# Patient Record
Sex: Female | Born: 1985 | Race: Black or African American | Hispanic: No | Marital: Single | Smoking: Former smoker
Health system: Southern US, Community
[De-identification: ages and names within clinical notes are randomized; demographics above are authoritative.]

## PROBLEM LIST (undated history)

## (undated) DIAGNOSIS — N390 Urinary tract infection, site not specified: Secondary | ICD-10-CM

## (undated) DIAGNOSIS — B009 Herpesviral infection, unspecified: Secondary | ICD-10-CM

## (undated) HISTORY — PX: NO PAST SURGERIES: SHX2092

## (undated) HISTORY — PX: TUBAL LIGATION: SHX77

---
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
## 2005-01-19 ENCOUNTER — Emergency Department (HOSPITAL_COMMUNITY): Admission: EM | Admit: 2005-01-19 | Discharge: 2005-01-19 | Payer: Self-pay | Admitting: Emergency Medicine

## 2005-04-12 ENCOUNTER — Emergency Department (HOSPITAL_COMMUNITY): Admission: EM | Admit: 2005-04-12 | Discharge: 2005-04-13 | Payer: Self-pay | Admitting: Emergency Medicine

## 2005-10-14 ENCOUNTER — Emergency Department (HOSPITAL_COMMUNITY): Admission: EM | Admit: 2005-10-14 | Discharge: 2005-10-14 | Payer: Self-pay | Admitting: Emergency Medicine

## 2006-05-21 ENCOUNTER — Inpatient Hospital Stay (HOSPITAL_COMMUNITY): Admission: RE | Admit: 2006-05-21 | Discharge: 2006-05-23 | Payer: Self-pay | Admitting: Obstetrics and Gynecology

## 2006-11-13 ENCOUNTER — Emergency Department (HOSPITAL_COMMUNITY): Admission: EM | Admit: 2006-11-13 | Discharge: 2006-11-13 | Payer: Self-pay | Admitting: Emergency Medicine

## 2007-08-13 ENCOUNTER — Emergency Department (HOSPITAL_COMMUNITY): Admission: EM | Admit: 2007-08-13 | Discharge: 2007-08-13 | Payer: Self-pay | Admitting: Emergency Medicine

## 2009-06-25 ENCOUNTER — Emergency Department (HOSPITAL_COMMUNITY): Admission: EM | Admit: 2009-06-25 | Discharge: 2009-06-25 | Payer: Self-pay | Admitting: Emergency Medicine

## 2010-10-15 ENCOUNTER — Emergency Department (HOSPITAL_COMMUNITY)
Admission: EM | Admit: 2010-10-15 | Discharge: 2010-10-15 | Disposition: A | Discharge status: Home / Self Care | Payer: Self-pay | Attending: Emergency Medicine | Admitting: Emergency Medicine

## 2010-10-15 ENCOUNTER — Emergency Department (HOSPITAL_COMMUNITY): Payer: Self-pay

## 2010-10-15 DIAGNOSIS — X58XXXA Exposure to other specified factors, initial encounter: Secondary | ICD-10-CM | POA: Insufficient documentation

## 2010-10-15 DIAGNOSIS — J45909 Unspecified asthma, uncomplicated: Secondary | ICD-10-CM | POA: Insufficient documentation

## 2010-10-15 DIAGNOSIS — M79609 Pain in unspecified limb: Secondary | ICD-10-CM | POA: Insufficient documentation

## 2010-10-15 DIAGNOSIS — IMO0002 Reserved for concepts with insufficient information to code with codable children: Secondary | ICD-10-CM | POA: Insufficient documentation

## 2010-10-15 DIAGNOSIS — M25559 Pain in unspecified hip: Secondary | ICD-10-CM | POA: Insufficient documentation

## 2010-10-15 LAB — POCT PREGNANCY, URINE: Preg Test, Ur: NEGATIVE

## 2011-01-13 ENCOUNTER — Emergency Department (HOSPITAL_COMMUNITY)
Admission: EM | Admit: 2011-01-13 | Discharge: 2011-01-13 | Disposition: A | Discharge status: Home / Self Care | Payer: Self-pay | Attending: Emergency Medicine | Admitting: Emergency Medicine

## 2011-01-13 DIAGNOSIS — K047 Periapical abscess without sinus: Secondary | ICD-10-CM | POA: Insufficient documentation

## 2011-01-13 DIAGNOSIS — K029 Dental caries, unspecified: Secondary | ICD-10-CM | POA: Insufficient documentation

## 2011-01-13 DIAGNOSIS — J45909 Unspecified asthma, uncomplicated: Secondary | ICD-10-CM | POA: Insufficient documentation

## 2011-01-13 DIAGNOSIS — K089 Disorder of teeth and supporting structures, unspecified: Secondary | ICD-10-CM | POA: Insufficient documentation

## 2011-01-25 NOTE — Op Note (Signed)
NAME:  Joan Peterson, Joan Peterson              ACCOUNT NO.:  [id number]   MEDICAL RECORD NO.:  [id number]          PATIENT TYPE:  INP   LOCATION:  LDR4                          FACILITY:  [facility]   PHYSICIAN:  Lazaro Arms, M.D.   DATE OF BIRTH:  1986-01-08   DATE OF PROCEDURE:  11/08/2004  DATE OF DISCHARGE:                                 OPERATIVE REPORT   DELIVERY NOTE:  Joan Peterson was noted to be 10 cm dilated at +2 station with an  urge to push at approximately 2:30.  After a very brief second stage, she  delivered a viable female infant at 13.  Twenty units of Pitocin dilated in  1000 mL of lactated Ringer's was then infused rapidly IV.  The placenta  separated spontaneously and delivered via controlled cord traction at 1445.  It was inspected and appears to be intact with a three-vessel cord.  As soon  as the placenta came out, heavy lochia was noted and the uterus was soft.  Despite vigorous massage and bimanual exam, uterine atony remained.  She was  given 0.2 mg of Methergine IM.  After a few minutes, the fundus did become  firm and the bleeding stopped.  Estimated blood loss was about 750 mL.  The  weight of the baby is 7 pounds 11 ounces, Apgars are 9 and 9.  Her vagina  did not have any lacerations.      FC/MEDQ  D:  11/08/2004  T:  11/08/2004  Job:  [id number]

## 2011-01-25 NOTE — H&P (Signed)
NAME:  Joan Peterson, Joan Peterson              ACCOUNT NO.:  [id number]   MEDICAL RECORD NO.:  [id number]          PATIENT TYPE:  INP   LOCATION:  A413                          FACILITY:  [facility]   PHYSICIAN:  Lazaro Arms, M.D.   DATE OF BIRTH:  01/13/1986   DATE OF ADMISSION:  05/21/2006  DATE OF DISCHARGE:  LH                                HISTORY & PHYSICAL   CHIEF COMPLAINT:  Labor.   HISTORY OF PRESENT ILLNESS:  Joan Peterson is a 25 year old, gravida 2, para 1 with  an EDC of May 27, 2006 placing her at [redacted] weeks gestation. She began  prenatal care at about [redacted] weeks gestation and has had regular visits since  then. The prenatal course has basically been uneventful.   PRENATAL LABS:  Blood type O+, rubella immune. HBsAg, HIV, RPR, gonorrhea,  chlamydia are all negative. 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 She has a history of positive group B strep,  the culture was negative with this pregnancy.   PAST MEDICAL HISTORY:  Noncontributory.   PAST SURGICAL HISTORY:  None.   ALLERGIES:  None.   MEDICATIONS:  1. Prenatal vitamins.  2. Valtrex 1 gram p.o. daily since [redacted] weeks gestation.   SOCIAL HISTORY:  Single, denies cigarette smoking, alcohol or drug use.   FAMILY HISTORY:  Diabetes.   OB HISTORY:  Had a vaginal delivery of a 7 pound 14 ounce female at [redacted] weeks  gestation in 2006 without complications.   PHYSICAL EXAMINATION:  HEENT:  Within normal limits.  HEART:  Regular rate and rhythm.  LUNGS:  Clear.  ABDOMEN:  Soft and nontender. She is having mild to moderate contractions  every 2-4 minutes.  CERVIX:  Cervical exam upon admission was 4 cm, 50% effaced, -2 station.  After an hour or so of observation, she did thin out a little bit more and  the cervix became more anterior so we are going to keep her for evaluation  and management of labor. We will start her on GBS prophylaxis. Fetal heart  rate is reactive without decels.      Jacklyn Shell, C.N.M.      Lazaro Arms, M.D.  Electronically Signed    FC/MEDQ  D:  05/22/2006  T:  05/22/2006  Job:  [id number]   cc:   Francoise Schaumann. Milford Cage DO, FAAP  Fax: [phone]

## 2011-01-25 NOTE — Op Note (Signed)
NAME:  Joan Peterson, Joan Peterson              ACCOUNT NO.:  [id number]   MEDICAL RECORD NO.:  [id number]          PATIENT TYPE:  INP   LOCATION:  LDR2                          FACILITY:  [facility]   PHYSICIAN:  Tilda Burrow, M.D. DATE OF BIRTH:  [birth year]   DATE OF PROCEDURE:  DATE OF DISCHARGE:                                  PROCEDURE NOTE   DATE OF ONSET OF LABOR:  May 20, 2006   DATE OF DELIVERY:  May 21, 2006, at 9:07 a.m.   LENGTH OF FIRST STAGE OF LABOR:  12 hours 6 minutes   LENGTH OF SECOND STAGE OF LABOR:  1 minute   LENGTH OF THIRD STAGE OF LABOR:  6 minutes   DELIVERY NOTE:  Joan Peterson had a normal spontaneous vaginal delivery of a viable  female infant.  Upon delivery infant had strong cry, pinked up well, movement  of all extremities.  Cord was clamped and cut, infant suctioned thoroughly,  and to mother's abdomen for newborn care.  Third stage of labor was actively  managed with 20 units of Pitocin in 1000 mL of D5LR at a rapid rate.  Cord  blood and cord blood gas was obtained.  Placenta was delivered spontaneously  via Schultze's mechanism.  Three-vessel cord is noted upon inspection.  Membranes are noted to be intact upon inspection.  The perineum was  inspected and noted to be intact.  Estimated blood loss was 150 mL.      Zerita Boers, Lanier Clam      Tilda Burrow, M.D.  Electronically Signed    DL/MEDQ  D:  08/65/7846  T:  05/21/2006  Job:  [id number]   cc:   Francoise Schaumann. Milford Cage DO, FAAP  Fax: [phone]

## 2011-08-10 ENCOUNTER — Inpatient Hospital Stay (HOSPITAL_COMMUNITY): Payer: Medicaid Other

## 2011-08-10 ENCOUNTER — Encounter (HOSPITAL_COMMUNITY): Payer: Self-pay

## 2011-08-10 ENCOUNTER — Inpatient Hospital Stay (HOSPITAL_COMMUNITY)
Admission: AD | Admit: 2011-08-10 | Discharge: 2011-08-10 | Disposition: A | Discharge status: Home / Self Care | Payer: Medicaid Other | Source: Ambulatory Visit | Attending: Obstetrics and Gynecology | Admitting: Obstetrics and Gynecology

## 2011-08-10 DIAGNOSIS — N760 Acute vaginitis: Secondary | ICD-10-CM | POA: Insufficient documentation

## 2011-08-10 DIAGNOSIS — Z3490 Encounter for supervision of normal pregnancy, unspecified, unspecified trimester: Secondary | ICD-10-CM

## 2011-08-10 DIAGNOSIS — O209 Hemorrhage in early pregnancy, unspecified: Secondary | ICD-10-CM | POA: Insufficient documentation

## 2011-08-10 DIAGNOSIS — B9689 Other specified bacterial agents as the cause of diseases classified elsewhere: Secondary | ICD-10-CM | POA: Insufficient documentation

## 2011-08-10 DIAGNOSIS — O2390 Unspecified genitourinary tract infection in pregnancy, unspecified trimester: Secondary | ICD-10-CM | POA: Insufficient documentation

## 2011-08-10 DIAGNOSIS — A499 Bacterial infection, unspecified: Secondary | ICD-10-CM | POA: Insufficient documentation

## 2011-08-10 HISTORY — DX: Urinary tract infection, site not specified: N39.0

## 2011-08-10 LAB — URINALYSIS, ROUTINE W REFLEX MICROSCOPIC
Bilirubin Urine: NEGATIVE
Ketones, ur: NEGATIVE mg/dL
Nitrite: NEGATIVE
Specific Gravity, Urine: <1.005 — ABNORMAL LOW (ref 1.005–1.030)
Urobilinogen, UA: 0.2 mg/dL (ref 0.0–1.0)

## 2011-08-10 LAB — CBC
HCT: 36 % (ref 36.0–46.0)
Hemoglobin: 11.8 g/dL — ABNORMAL LOW (ref 12.0–15.0)
MCHC: 32.8 g/dL (ref 30.0–36.0)
MCV: 86.3 fL (ref 78.0–100.0)
WBC: 9 10*3/uL (ref 4.0–10.5)

## 2011-08-10 LAB — WET PREP, GENITAL
Trich, Wet Prep: NONE SEEN
Yeast Wet Prep HPF POC: NONE SEEN

## 2011-08-10 LAB — ABO/RH: ABO/RH(D): O POS

## 2011-08-10 LAB — URINE MICROSCOPIC-ADD ON

## 2011-08-10 MED ORDER — METRONIDAZOLE 500 MG PO TABS: 500.0000 mg | ORAL_TABLET | Freq: Two times a day (BID) | ORAL | Status: AC

## 2011-08-10 NOTE — ED Provider Notes (Signed)
Chief Complaint:  Vaginal Bleeding   Joan Peterson is  25 y.o. [id number].  Patient's last menstrual period was 06/17/2011.Marland Kitchen  Her pregnancy status is positive. [redacted]w[redacted]d  She presents complaining of Vaginal Bleeding . Onset is described as unclear and has been present for  1-2 weeks. Reports vaginal bleeding like period with quarter-sized clots and period-like cramping. States bleeding heavier today. Denies fever, chills, N/V/D  Obstetrical/Gynecological History: OB History    Grav Para Term Preterm Abortions TAB SAB Ect Mult Living   3 2 2       2       Past Medical History: Past Medical History  Diagnosis Date  . Urinary tract infection     Past Surgical History: Past Surgical History  Procedure Date  . No past surgeries     Family History: Family History  Problem Relation Age of Onset  . Hypertension Father   . Hearing loss Father     Social History: History  Substance Use Topics  . Smoking status: Current Everyday Smoker -- 0.2 packs/day  . Smokeless tobacco: Not on file  . Alcohol Use: No    Allergies:  Allergies  Allergen Reactions  . Ibuprofen Hives    Prescriptions prior to admission  Medication Sig Dispense Refill  . acetaminophen (TYLENOL) 500 MG tablet Take 500 mg by mouth every 6 (six) hours as needed. Pain        . prenatal vitamin w/FE, FA (PRENATAL 1 + 1) 27-1 MG TABS Take 1 tablet by mouth daily.          Review of Systems - Negative except what has been reviewed in the HPI  Physical Exam   Blood pressure 114/63, pulse 84, temperature 99.1 F (37.3 C), temperature source Oral, resp. rate 20, height 5\' 5"  (1.651 m), weight 113.399 kg (250 lb), last menstrual period 06/17/2011, SpO2 98.00%.  General: General appearance - alert, well appearing, and in no distress, oriented to person, place, and time and overweight Mental status - alert, oriented to person, place, and time, normal mood, behavior, speech, dress, motor activity, and thought processes,  affect appropriate to mood Abdomen - Obese, suprapubic tenderness Focused Gynecological Exam: VULVA: normal appearing vulva with no masses, tenderness or lesions, VAGINA: vaginal discharge - bloody and small amt, CERVIX: closed, no CMT, UTERUS: enlarged to 8 week's size, nontender, ADNEXA: normal adnexa in size, nontender and no masses  Labs: Recent Results (from the past 24 hour(s))  URINALYSIS, ROUTINE W REFLEX MICROSCOPIC   Collection Time   08/10/11 10:35 AM      Component Value Range   Color, Urine YELLOW  YELLOW    APPearance CLEAR  CLEAR    Specific Gravity, Urine <1.005 (*) 1.005 - 1.030    pH 6.5  5.0 - 8.0    Glucose, UA NEGATIVE  NEGATIVE (mg/dL)   Hgb urine dipstick LARGE (*) NEGATIVE    Bilirubin Urine NEGATIVE  NEGATIVE    Ketones, ur NEGATIVE  NEGATIVE (mg/dL)   Protein, ur NEGATIVE  NEGATIVE (mg/dL)   Urobilinogen, UA 0.2  0.0 - 1.0 (mg/dL)   Nitrite NEGATIVE  NEGATIVE    Leukocytes, UA TRACE (*) NEGATIVE   URINE MICROSCOPIC-ADD ON   Collection Time   08/10/11 10:35 AM      Component Value Range   Squamous Epithelial / LPF FEW (*) RARE    WBC, UA 0-2  <3 (WBC/hpf)   RBC / HPF 0-2  <3 (RBC/hpf)   Bacteria, UA RARE  RARE  POCT PREGNANCY, URINE   Collection Time   08/10/11 10:39 AM      Component Value Range   Preg Test, Ur POSITIVE    HCG, QUANTITATIVE, PREGNANCY   Collection Time   08/10/11 11:02 AM      Component Value Range   hCG, Beta Chain, Joan Peterson, [place] [id number] (*) <5 (mIU/mL)  ABO/RH   Collection Time   08/10/11 11:02 AM      Component Value Range   ABO/RH(D) O POS    CBC   Collection Time   08/10/11 11:02 AM      Component Value Range   WBC 9.0  4.0 - 10.5 (K/uL)   RBC 4.17  3.87 - 5.11 (MIL/uL)   Hemoglobin 11.8 (*) 12.0 - 15.0 (g/dL)   HCT 84.6  96.2 - 95.2 (%)   MCV 86.3  78.0 - 100.0 (fL)   MCH 28.3  26.0 - 34.0 (pg)   MCHC 32.8  30.0 - 36.0 (g/dL)   RDW 84.1  32.4 - 40.1 (%)   Platelets 257  150 - 400 (K/uL)  WET PREP, GENITAL   Collection  Time   08/10/11 11:09 AM      Component Value Range   Yeast, Wet Prep NONE SEEN  NONE SEEN    Trich, Wet Prep NONE SEEN  NONE SEEN    Clue Cells, Wet Prep MODERATE (*) NONE SEEN    WBC, Wet Prep HPF POC MODERATE (*) NONE SEEN    Imaging Studies:  Trans-vag [place] attempted at bedside. Gest sac noted, unable to visualize yolk sac. ?SCH. Will proceed with trans-vaginal [place] in radiology.  Offical [place]: [redacted]w[redacted]d with cardiac activity   Assessment: Viable IUP at [redacted]w[redacted]d BV  Plan: Discharge home Rx Flagyl Referral to [facility] for care Verification letter given  Joan Peterson E. 08/10/2011,12:34 PM

## 2011-08-10 NOTE — Progress Notes (Signed)
Patient states she had a positive pregnancy test at [facility] about 2 weeks ago. Was having some spotting them but it stopped. Started again this morning when waking up had blood on the tissue with one small clot. Has had cramping since the bleeding started.

## 2012-03-06 LAB — OB RESULTS CONSOLE HEPATITIS B SURFACE ANTIGEN: Hepatitis B Surface Ag: NEGATIVE

## 2012-03-06 LAB — OB RESULTS CONSOLE ABO/RH: RH Type: POSITIVE

## 2012-03-06 LAB — OB RESULTS CONSOLE ANTIBODY SCREEN: Antibody Screen: NEGATIVE

## 2012-03-17 LAB — OB RESULTS CONSOLE GBS: GBS: POSITIVE

## 2012-03-23 DIAGNOSIS — Z3490 Encounter for supervision of normal pregnancy, unspecified, unspecified trimester: Secondary | ICD-10-CM

## 2012-04-01 ENCOUNTER — Inpatient Hospital Stay (HOSPITAL_COMMUNITY): Payer: Medicaid Other | Admitting: Anesthesiology

## 2012-04-01 ENCOUNTER — Encounter (HOSPITAL_COMMUNITY): Payer: Self-pay | Admitting: *Deleted

## 2012-04-01 ENCOUNTER — Encounter (HOSPITAL_COMMUNITY)
Admission: AD | Disposition: A | Discharge status: Home / Self Care | Payer: Self-pay | Source: Ambulatory Visit | Attending: Obstetrics & Gynecology

## 2012-04-01 ENCOUNTER — Inpatient Hospital Stay (HOSPITAL_COMMUNITY)
Admission: AD | Admit: 2012-04-01 | Discharge: 2012-04-03 | DRG: 766 | Disposition: A | Discharge status: Home / Self Care | Payer: Medicaid Other | Source: Ambulatory Visit | Attending: Obstetrics & Gynecology | Admitting: Obstetrics & Gynecology

## 2012-04-01 ENCOUNTER — Encounter (HOSPITAL_COMMUNITY): Payer: Self-pay | Admitting: Anesthesiology

## 2012-04-01 DIAGNOSIS — O9989 Other specified diseases and conditions complicating pregnancy, childbirth and the puerperium: Secondary | ICD-10-CM

## 2012-04-01 DIAGNOSIS — IMO0001 Reserved for inherently not codable concepts without codable children: Secondary | ICD-10-CM

## 2012-04-01 DIAGNOSIS — Z302 Encounter for sterilization: Secondary | ICD-10-CM

## 2012-04-01 DIAGNOSIS — O99892 Other specified diseases and conditions complicating childbirth: Secondary | ICD-10-CM | POA: Diagnosis present

## 2012-04-01 DIAGNOSIS — A6000 Herpesviral infection of urogenital system, unspecified: Secondary | ICD-10-CM

## 2012-04-01 DIAGNOSIS — O98519 Other viral diseases complicating pregnancy, unspecified trimester: Secondary | ICD-10-CM

## 2012-04-01 DIAGNOSIS — Z22330 Carrier of Group B streptococcus: Secondary | ICD-10-CM

## 2012-04-01 DIAGNOSIS — Z98891 History of uterine scar from previous surgery: Secondary | ICD-10-CM

## 2012-04-01 HISTORY — DX: Herpesviral infection, unspecified: B00.9

## 2012-04-01 LAB — CBC
HCT: 32.2 % — ABNORMAL LOW (ref 36.0–46.0)
Hemoglobin: 10.5 g/dL — ABNORMAL LOW (ref 12.0–15.0)
MCH: 28.1 pg (ref 26.0–34.0)
MCHC: 32.6 g/dL (ref 30.0–36.0)
MCV: 86.1 fL (ref 78.0–100.0)

## 2012-04-01 LAB — PREPARE RBC (CROSSMATCH)

## 2012-04-01 SURGERY — Surgical Case
Anesthesia: Spinal | Site: Abdomen | Wound class: Clean Contaminated

## 2012-04-01 MED ORDER — ONDANSETRON HCL 4 MG/2ML IJ SOLN
INTRAMUSCULAR | Status: AC
  Filled 2012-04-01: qty 2

## 2012-04-01 MED ORDER — DIPHENHYDRAMINE HCL 50 MG/ML IJ SOLN: 25.0000 mg | INTRAMUSCULAR | Status: DC | PRN

## 2012-04-01 MED ORDER — ONDANSETRON HCL 4 MG/2ML IJ SOLN
INTRAMUSCULAR | Status: DC | PRN
  Administered 2012-04-01: 4 mg via INTRAVENOUS

## 2012-04-01 MED ORDER — WITCH HAZEL-GLYCERIN EX PADS: 1.0000 "application " | MEDICATED_PAD | CUTANEOUS | Status: DC | PRN

## 2012-04-01 MED ORDER — TERBUTALINE SULFATE 1 MG/ML IJ SOLN: 0.2500 mg | Freq: Once | INTRAMUSCULAR | Status: DC

## 2012-04-01 MED ORDER — NALBUPHINE HCL 10 MG/ML IJ SOLN
5.0000 mg | INTRAMUSCULAR | Status: DC | PRN
  Filled 2012-04-01: qty 1

## 2012-04-01 MED ORDER — EPHEDRINE SULFATE 50 MG/ML IJ SOLN
INTRAMUSCULAR | Status: DC | PRN
  Administered 2012-04-01 (×2): 10 mg via INTRAVENOUS

## 2012-04-01 MED ORDER — BUTORPHANOL TARTRATE 2 MG/ML IJ SOLN
1.0000 mg | INTRAMUSCULAR | Status: DC | PRN
  Administered 2012-04-01: 1 mg via INTRAVENOUS
  Filled 2012-04-01: qty 1

## 2012-04-01 MED ORDER — OXYTOCIN 10 UNIT/ML IJ SOLN
40.0000 [IU] | INTRAVENOUS | Status: DC | PRN
  Administered 2012-04-01: 40 [IU] via INTRAVENOUS

## 2012-04-01 MED ORDER — METOCLOPRAMIDE HCL 5 MG/ML IJ SOLN: 10.0000 mg | Freq: Three times a day (TID) | INTRAMUSCULAR | Status: DC | PRN

## 2012-04-01 MED ORDER — ONDANSETRON HCL 4 MG/2ML IJ SOLN: 4.0000 mg | Freq: Four times a day (QID) | INTRAMUSCULAR | Status: DC | PRN

## 2012-04-01 MED ORDER — OXYTOCIN 10 UNIT/ML IJ SOLN
INTRAMUSCULAR | Status: AC
  Filled 2012-04-01: qty 4

## 2012-04-01 MED ORDER — OXYTOCIN 40 UNITS IN LACTATED RINGERS INFUSION - SIMPLE MED: 62.5000 mL/h | INTRAVENOUS | Status: AC

## 2012-04-01 MED ORDER — TETANUS-DIPHTH-ACELL PERTUSSIS 5-2.5-18.5 LF-MCG/0.5 IM SUSP
0.5000 mL | Freq: Once | INTRAMUSCULAR | Status: DC
  Filled 2012-04-01: qty 0.5

## 2012-04-01 MED ORDER — OXYCODONE-ACETAMINOPHEN 5-325 MG PO TABS: 1.0000 | ORAL_TABLET | ORAL | Status: DC | PRN

## 2012-04-01 MED ORDER — SCOPOLAMINE 1 MG/3DAYS TD PT72
1.0000 | MEDICATED_PATCH | Freq: Once | TRANSDERMAL | Status: DC
  Administered 2012-04-01: 1.5 mg via TRANSDERMAL

## 2012-04-01 MED ORDER — PRENATAL MULTIVITAMIN CH
1.0000 | ORAL_TABLET | Freq: Every day | ORAL | Status: DC
  Administered 2012-04-03: 1 via ORAL
  Filled 2012-04-01: qty 1

## 2012-04-01 MED ORDER — ONDANSETRON HCL 4 MG/2ML IJ SOLN: 4.0000 mg | Freq: Three times a day (TID) | INTRAMUSCULAR | Status: DC | PRN

## 2012-04-01 MED ORDER — SENNOSIDES-DOCUSATE SODIUM 8.6-50 MG PO TABS
2.0000 | ORAL_TABLET | Freq: Every day | ORAL | Status: DC
  Administered 2012-04-01 – 2012-04-03 (×3): 2 via ORAL

## 2012-04-01 MED ORDER — ZOLPIDEM TARTRATE 5 MG PO TABS: 5.0000 mg | ORAL_TABLET | Freq: Every evening | ORAL | Status: DC | PRN

## 2012-04-01 MED ORDER — FENTANYL CITRATE 0.05 MG/ML IJ SOLN
25.0000 ug | INTRAMUSCULAR | Status: DC | PRN
  Administered 2012-04-01: 50 ug via INTRAVENOUS

## 2012-04-01 MED ORDER — ONDANSETRON HCL 4 MG/2ML IJ SOLN: 4.0000 mg | INTRAMUSCULAR | Status: DC | PRN

## 2012-04-01 MED ORDER — VANCOMYCIN HCL IN DEXTROSE 1-5 GM/200ML-% IV SOLN
1000.0000 mg | Freq: Two times a day (BID) | INTRAVENOUS | Status: DC
  Filled 2012-04-01: qty 200

## 2012-04-01 MED ORDER — BUPIVACAINE HCL (PF) 0.25 % IJ SOLN
INTRAMUSCULAR | Status: DC | PRN
  Administered 2012-04-01: 30 mL

## 2012-04-01 MED ORDER — LACTATED RINGERS IV SOLN
INTRAVENOUS | Status: DC | PRN
  Administered 2012-04-01: 11:00:00 via INTRAVENOUS

## 2012-04-01 MED ORDER — ONDANSETRON HCL 4 MG PO TABS: 4.0000 mg | ORAL_TABLET | ORAL | Status: DC | PRN

## 2012-04-01 MED ORDER — OXYTOCIN 40 UNITS IN LACTATED RINGERS INFUSION - SIMPLE MED: 62.5000 mL/h | Freq: Once | INTRAVENOUS | Status: DC

## 2012-04-01 MED ORDER — LACTATED RINGERS IV SOLN: 500.0000 mL | INTRAVENOUS | Status: DC | PRN

## 2012-04-01 MED ORDER — GENTAMICIN SULFATE 40 MG/ML IJ SOLN: INTRAVENOUS | Status: DC | PRN

## 2012-04-01 MED ORDER — DEXTROSE 5 % IV SOLN
INTRAVENOUS | Status: DC
  Administered 2012-04-01: 10:00:00 via INTRAVENOUS
  Filled 2012-04-01: qty 9.91

## 2012-04-01 MED ORDER — MORPHINE SULFATE (PF) 0.5 MG/ML IJ SOLN
INTRAMUSCULAR | Status: DC | PRN
  Administered 2012-04-01: .1 mg via INTRATHECAL

## 2012-04-01 MED ORDER — EPHEDRINE 5 MG/ML INJ
INTRAVENOUS | Status: AC
  Filled 2012-04-01: qty 10

## 2012-04-01 MED ORDER — MAGNESIUM HYDROXIDE 400 MG/5ML PO SUSP: 30.0000 mL | ORAL | Status: DC | PRN

## 2012-04-01 MED ORDER — SIMETHICONE 80 MG PO CHEW: 80.0000 mg | CHEWABLE_TABLET | ORAL | Status: DC | PRN

## 2012-04-01 MED ORDER — SODIUM CHLORIDE 0.9 % IJ SOLN
3.0000 mL | Freq: Two times a day (BID) | INTRAMUSCULAR | Status: DC
Start: 2012-04-02 — End: 2012-04-04

## 2012-04-01 MED ORDER — FENTANYL CITRATE 0.05 MG/ML IJ SOLN
INTRAMUSCULAR | Status: DC | PRN
  Administered 2012-04-01: 15 ug via INTRATHECAL

## 2012-04-01 MED ORDER — ACETAMINOPHEN 10 MG/ML IV SOLN
1000.0000 mg | Freq: Four times a day (QID) | INTRAVENOUS | Status: AC
  Administered 2012-04-01 – 2012-04-02 (×3): 1000 mg via INTRAVENOUS
  Filled 2012-04-01 (×4): qty 100

## 2012-04-01 MED ORDER — LANOLIN HYDROUS EX OINT: 1.0000 "application " | TOPICAL_OINTMENT | CUTANEOUS | Status: DC | PRN

## 2012-04-01 MED ORDER — DIPHENHYDRAMINE HCL 25 MG PO CAPS: 25.0000 mg | ORAL_CAPSULE | Freq: Four times a day (QID) | ORAL | Status: DC | PRN

## 2012-04-01 MED ORDER — CITRIC ACID-SODIUM CITRATE 334-500 MG/5ML PO SOLN
30.0000 mL | ORAL | Status: DC | PRN
  Administered 2012-04-01: 30 mL via ORAL
  Filled 2012-04-01: qty 15

## 2012-04-01 MED ORDER — DIPHENHYDRAMINE HCL 25 MG PO CAPS: 25.0000 mg | ORAL_CAPSULE | ORAL | Status: DC | PRN

## 2012-04-01 MED ORDER — DIBUCAINE 1 % RE OINT: 1.0000 "application " | TOPICAL_OINTMENT | RECTAL | Status: DC | PRN

## 2012-04-01 MED ORDER — BUPIVACAINE IN DEXTROSE 0.75-8.25 % IT SOLN
INTRATHECAL | Status: DC | PRN
  Administered 2012-04-01: 1.5 mL via INTRATHECAL

## 2012-04-01 MED ORDER — PHENYLEPHRINE HCL 10 MG/ML IJ SOLN
INTRAMUSCULAR | Status: DC | PRN
  Administered 2012-04-01 (×5): 40 ug via INTRAVENOUS

## 2012-04-01 MED ORDER — NALOXONE HCL 0.4 MG/ML IJ SOLN: 0.4000 mg | INTRAMUSCULAR | Status: DC | PRN

## 2012-04-01 MED ORDER — FENTANYL CITRATE 0.05 MG/ML IJ SOLN
INTRAMUSCULAR | Status: AC
  Filled 2012-04-01: qty 2

## 2012-04-01 MED ORDER — SODIUM CHLORIDE 0.9 % IJ SOLN: 3.0000 mL | INTRAMUSCULAR | Status: DC | PRN

## 2012-04-01 MED ORDER — MEPERIDINE HCL 25 MG/ML IJ SOLN: 6.2500 mg | INTRAMUSCULAR | Status: DC | PRN

## 2012-04-01 MED ORDER — FLEET ENEMA 7-19 GM/118ML RE ENEM: 1.0000 | ENEMA | RECTAL | Status: DC | PRN

## 2012-04-01 MED ORDER — OXYTOCIN BOLUS FROM INFUSION
250.0000 mL | Freq: Once | INTRAVENOUS | Status: DC
  Filled 2012-04-01: qty 500

## 2012-04-01 MED ORDER — FENTANYL CITRATE 0.05 MG/ML IJ SOLN
INTRAMUSCULAR | Status: AC
  Administered 2012-04-01: 50 ug via INTRAVENOUS
  Filled 2012-04-01: qty 2

## 2012-04-01 MED ORDER — ACETAMINOPHEN 325 MG PO TABS: 650.0000 mg | ORAL_TABLET | ORAL | Status: DC | PRN

## 2012-04-01 MED ORDER — SODIUM CHLORIDE 0.9 % IV SOLN
1.0000 ug/kg/h | INTRAVENOUS | Status: DC | PRN
  Filled 2012-04-01: qty 2.5

## 2012-04-01 MED ORDER — SCOPOLAMINE 1 MG/3DAYS TD PT72
MEDICATED_PATCH | TRANSDERMAL | Status: AC
  Administered 2012-04-01: 1.5 mg via TRANSDERMAL
  Filled 2012-04-01: qty 1

## 2012-04-01 MED ORDER — SODIUM CHLORIDE 0.9 % IV SOLN
250.0000 mL | INTRAVENOUS | Status: DC
  Administered 2012-04-01: 250 mL via INTRAVENOUS

## 2012-04-01 MED ORDER — MORPHINE SULFATE 0.5 MG/ML IJ SOLN
INTRAMUSCULAR | Status: AC
  Filled 2012-04-01: qty 10

## 2012-04-01 MED ORDER — DIPHENHYDRAMINE HCL 50 MG/ML IJ SOLN: 12.5000 mg | INTRAMUSCULAR | Status: DC | PRN

## 2012-04-01 MED ORDER — LIDOCAINE HCL (PF) 1 % IJ SOLN: 30.0000 mL | INTRAMUSCULAR | Status: DC | PRN

## 2012-04-01 MED ORDER — PHENYLEPHRINE 40 MCG/ML (10ML) SYRINGE FOR IV PUSH (FOR BLOOD PRESSURE SUPPORT)
PREFILLED_SYRINGE | INTRAVENOUS | Status: AC
  Filled 2012-04-01: qty 5

## 2012-04-01 MED ORDER — OXYCODONE-ACETAMINOPHEN 5-325 MG PO TABS
1.0000 | ORAL_TABLET | ORAL | Status: DC | PRN
  Administered 2012-04-02 (×2): 2 via ORAL
  Administered 2012-04-02: 1 via ORAL
  Administered 2012-04-02 – 2012-04-03 (×5): 2 via ORAL
  Filled 2012-04-01 (×2): qty 1
  Filled 2012-04-01 (×2): qty 2
  Filled 2012-04-01: qty 1
  Filled 2012-04-01 (×4): qty 2

## 2012-04-01 MED ORDER — MENTHOL 3 MG MT LOZG: 1.0000 | LOZENGE | OROMUCOSAL | Status: DC | PRN

## 2012-04-01 MED ORDER — LACTATED RINGERS IV SOLN
INTRAVENOUS | Status: DC
  Administered 2012-04-01 (×3): via INTRAVENOUS

## 2012-04-01 MED ORDER — TERBUTALINE SULFATE 1 MG/ML IJ SOLN
INTRAMUSCULAR | Status: AC
  Filled 2012-04-01: qty 1

## 2012-04-01 MED ORDER — BUPIVACAINE HCL (PF) 0.25 % IJ SOLN
INTRAMUSCULAR | Status: AC
  Filled 2012-04-01: qty 30

## 2012-04-01 SURGICAL SUPPLY — 34 items
CHLORAPREP W/TINT 26ML (MISCELLANEOUS) ×3 IMPLANT
CLIP FILSHIE TUBAL LIGA STRL (Clip) ×3 IMPLANT
CLOTH BEACON ORANGE TIMEOUT ST (SAFETY) ×3 IMPLANT
DRESSING TELFA 8X3 (GAUZE/BANDAGES/DRESSINGS) ×3 IMPLANT
ELECT REM PT RETURN 9FT ADLT (ELECTROSURGICAL) ×3
ELECTRODE REM PT RTRN 9FT ADLT (ELECTROSURGICAL) ×2 IMPLANT
EXTRACTOR VACUUM M CUP 4 TUBE (SUCTIONS) IMPLANT
GAUZE SPONGE 4X4 12PLY STRL LF (GAUZE/BANDAGES/DRESSINGS) ×6 IMPLANT
GLOVE BIO SURGEON STRL SZ7 (GLOVE) ×3 IMPLANT
GLOVE BIOGEL PI IND STRL 7.0 (GLOVE) ×4 IMPLANT
GLOVE BIOGEL PI INDICATOR 7.0 (GLOVE) ×2
GOWN PREVENTION PLUS LG XLONG (DISPOSABLE) ×9 IMPLANT
KIT ABG SYR 3ML LUER SLIP (SYRINGE) IMPLANT
NEEDLE HYPO 22GX1.5 SAFETY (NEEDLE) ×3 IMPLANT
NEEDLE HYPO 25X5/8 SAFETYGLIDE (NEEDLE) ×3 IMPLANT
NS IRRIG 1000ML POUR BTL (IV SOLUTION) ×3 IMPLANT
PACK C SECTION WH (CUSTOM PROCEDURE TRAY) ×3 IMPLANT
PAD ABD 7.5X8 STRL (GAUZE/BANDAGES/DRESSINGS) ×3 IMPLANT
RTRCTR C-SECT PINK 25CM LRG (MISCELLANEOUS) ×3 IMPLANT
SLEEVE SCD COMPRESS KNEE LRG (MISCELLANEOUS) IMPLANT
SLEEVE SCD COMPRESS KNEE MED (MISCELLANEOUS) IMPLANT
SPONGE GAUZE 4X4 12PLY (GAUZE/BANDAGES/DRESSINGS) ×3 IMPLANT
SUT MNCRL 0 VIOLET CTX 36 (SUTURE) ×4 IMPLANT
SUT MONOCRYL 0 CTX 36 (SUTURE) ×2
SUT PDS AB 0 CT1 27 (SUTURE) IMPLANT
SUT VIC AB 0 CT1 36 (SUTURE) ×6 IMPLANT
SUT VIC AB 2-0 CT1 27 (SUTURE) ×1
SUT VIC AB 2-0 CT1 TAPERPNT 27 (SUTURE) ×2 IMPLANT
SUT VIC AB 4-0 KS 27 (SUTURE) ×3 IMPLANT
SYR CONTROL 10ML LL (SYRINGE) ×3 IMPLANT
TAPE CLOTH SURG 4X10 WHT LF (GAUZE/BANDAGES/DRESSINGS) ×3 IMPLANT
TOWEL OR 17X24 6PK STRL BLUE (TOWEL DISPOSABLE) ×6 IMPLANT
TRAY FOLEY CATH 14FR (SET/KITS/TRAYS/PACK) ×3 IMPLANT
WATER STERILE IRR 1000ML POUR (IV SOLUTION) ×3 IMPLANT

## 2012-04-01 NOTE — Progress Notes (Signed)
Joan Peterson is a 26 y.o. [id number] at [redacted]w[redacted]d admitted for spontaneous onset of labor.  Subjective: Pt reported to nursing that "I don't think my herpes medicine is working because I have a tingling spot and a bump at the top of my vagina."  Her last HSV outbreak was 1 month ago.  She has been on anti-virals for the last several months.  She is still feeling ctx but is comfortable with IV pain meds for now.  No LOF.  Objective: BP 134/71  Pulse 105  Temp 97.9 F (36.6 C) (Oral)  Resp 18  Ht 5\' 4"  (1.626 m)  Wt 116.121 kg (256 lb)  BMI 43.94 kg/m2  SpO2 99%  LMP 06/17/2011     FHT:  FHR: 150 bpm, variability: moderate,  accelerations:  Abscent,  decelerations:  Absent UC:   regular, every 5 minutes SVE:   Dilation: 5.5 Effacement (%): 80 Station: -3 Exam by:: Wynonia Musty MD @ [phone] *Repeat cervical exam deferred*  Membranes still intact  Labs: Lab Results  Component Value Date   WBC 11.0* 04/01/2012   HGB 10.5* 04/01/2012   HCT 32.2* 04/01/2012   MCV 86.1 04/01/2012   PLT 253 04/01/2012    Assessment / Plan: Spontaneous labor, progressing normally.  Dr. Macon Large has evaluated and has elected to perform C/S due to prodromal HSV sx due to current guidelines.  Labor: Progressing normally Fetal Wellbeing:  Category I Pain Control:  Fentanyl Anticipated MOD:  Unfortunately, given prodromal symptoms of HSV outbreak, will need Cesarean section  [name] 04/01/2012, 10:12 AM

## 2012-04-01 NOTE — Anesthesia Postprocedure Evaluation (Signed)
  Anesthesia Post-op Note  Patient: Joan Peterson  Procedure(s) Performed: Procedure(s) (LRB): CESAREAN SECTION WITH BILATERAL TUBAL LIGATION (N/A)  Patient Location: Mother/Baby  Anesthesia Type: Spinal  Level of Consciousness: awake  Airway and Oxygen Therapy: Patient Spontanous Breathing  Post-op Pain: mild  Post-op Assessment: Patient's Cardiovascular Status Stable and Respiratory Function Stable  Post-op Vital Signs: stable  Complications: No apparent anesthesia complications

## 2012-04-01 NOTE — Anesthesia Preprocedure Evaluation (Signed)
Anesthesia Evaluation  Patient identified by MRN, date of birth, ID band Patient awake    Reviewed: Allergy & Precautions, H&P , NPO status , Patient's Chart, lab work & pertinent test results, reviewed documented beta blocker date and time   History of Anesthesia Complications Negative for: history of anesthetic complications  Airway Mallampati: II TM Distance: >3 FB Neck ROM: full    Dental  (+) Teeth Intact   Pulmonary Current Smoker,  breath sounds clear to auscultation        Cardiovascular negative cardio ROS  Rhythm:regular Rate:Normal     Neuro/Psych negative neurological ROS  negative psych ROS   GI/Hepatic negative GI ROS, Neg liver ROS,   Endo/Other  Morbid obesity  Renal/GU negative Renal ROS  Female GU complaint (HSV)     Musculoskeletal   Abdominal   Peds  Hematology negative hematology ROS (+)   Anesthesia Other Findings   Reproductive/Obstetrics (+) Pregnancy (HSV+, in labor)                           Anesthesia Physical Anesthesia Plan  ASA: III and Emergent  Anesthesia Plan: Spinal   Post-op Pain Management:    Induction:   Airway Management Planned:   Additional Equipment:   Intra-op Plan:   Post-operative Plan:   Informed Consent: I have reviewed the patients History and Physical, chart, labs and discussed the procedure including the risks, benefits and alternatives for the proposed anesthesia with the patient or authorized representative who has indicated his/her understanding and acceptance.     Plan Discussed with: CRNA  Anesthesia Plan Comments:         Anesthesia Quick Evaluation

## 2012-04-01 NOTE — H&P (Signed)
Attending Note  Patient actually reports having a current outbreak and has a lesion on her vulva.  As a result, labor is contraindicated, cesarean section recommended.  Patient also desires a BTS. Discussed risks of HSV infection of the newborn which could lead to encephalitis, meningitis, other sequelae with increased morbidity and mortality risks. She had been on suppression therapy with Acyclovir but reported having an outbreak last month also.  Neonatology notified, prenatal transfer tool updated. The risks of cesarean section were discussed with the patient including but were not limited to: bleeding which may require transfusion or reoperation; infection which may require antibiotics; injury to bowel, bladder, ureters or other surrounding organs; injury to the fetus; need for additional procedures including hysterectomy in the event of a life-threatening hemorrhage; placental abnormalities wth subsequent pregnancies, incisional problems, thromboembolic phenomenon and other postoperative/anesthesia complications.  Patient also desires permanent sterilization.  Other reversible forms of contraception were discussed with patient; she declines all other modalities. Risks of procedure discussed with patient including but not limited to: risk of regret, permanence of method, bleeding, infection, injury to surrounding organs and need for additional procedures.  Failure risk of 0.5-1% with increased risk of ectopic gestation if pregnancy occurs was also discussed with patient.  The patient concurred with the proposed plan, giving informed written consent for the procedures.  Patient has been NPO since 2100 last nightshe will remain NPO for procedure. Anesthesia and OR aware.  Preoperative prophylactic antibiotics and SCDs ordered on call to the OR.  To OR when ready.  Jaynie Collins, M.D. 04/01/2012 10:01 AM

## 2012-04-01 NOTE — Progress Notes (Signed)
0830 Resident called and notified during admission process pt reports HSV lesion outbreak 1 month ago and 1 current that she pointed to and palpated at the top of her vagina. Md called to see pt as pt is in pain and asking for IV pain med.

## 2012-04-01 NOTE — Op Note (Signed)
Joan Peterson PROCEDURE DATE: 04/01/2012  PREOPERATIVE DIAGNOSIS: Intrauterine pregnancy at  [redacted]w[redacted]d weeks gestation; active labor; current genital HSV outbreak; undesired fertility  POSTOPERATIVE DIAGNOSIS: The same  PROCEDURE: Primary Low Transverse Cesarean Section, Bilateral Tubal Sterilization using Filshie clips  SURGEON:  Dr. Jaynie Collins  ASSISTANT:  Golden Circle, PA-S  ANESTHESIOLOGIST: Dana Allan, MD  INDICATIONS: Joan Peterson is a 26 y.o. [phone] at [redacted]w[redacted]d here for cesarean section and bilateral tubal sterilization secondary to the indications listed under preoperative diagnosis; please see preoperative note for further details.  The risks of surgery were discussed with the patient including but were not limited to: bleeding which may require transfusion or reoperation; infection which may require antibiotics; injury to bowel, bladder, ureters or other surrounding organs; injury to the fetus; need for additional procedures including hysterectomy in the event of a life-threatening hemorrhage; placental abnormalities wth subsequent pregnancies, incisional problems, thromboembolic phenomenon and other postoperative/anesthesia complications.  Patient also desires permanent sterilization.  Other reversible forms of contraception were discussed with patient; she declines all other modalities. Risks of procedure discussed with patient including but not limited to: risk of regret, permanence of method, bleeding, infection, injury to surrounding organs and need for additional procedures.  Failure risk of 0.5-1% with increased risk of ectopic gestation if pregnancy occurs was also discussed with patient.  The patient concurred with the proposed plan, giving informed written consent for the procedures.    FINDINGS:  Viable female infant in cephalic presentation.  Apgars 9 and 9.  Clear amniotic fluid.  Intact placenta, three vessel cord.  Normal uterus, fallopian tubes and ovaries  bilaterally.  ANESTHESIA: Spinal INTRAVENOUS FLUIDS: 2600 ml ESTIMATED BLOOD LOSS: 900 ml URINE OUTPUT:  50 ml SPECIMENS: Placenta sent to pathology COMPLICATIONS: None immediate  PROCEDURE IN DETAIL:  The patient preoperatively received intravenous antibiotics and had sequential compression devices applied to her lower extremities.   She was then taken to the operating room where spinal anesthesia was administered and was found to be adequate. She was then placed in a dorsal supine position with a leftward tilt, and prepped and draped in a sterile manner.  A foley catheter was placed into her bladder and attached to constant gravity.  After an adequate timeout was performed, a Pfannenstiel skin incision was made with scalpel and carried through to the underlying layer of fascia. The fascia was incised in the midline, and this incision was extended bilaterally using the Mayo scissors.  Kocher clamps were applied to the superior aspect of the fascial incision and the underlying rectus muscles were dissected off bluntly. A similar process was carried out on the inferior aspect of the fascial incision. The rectus muscles were separated in the midline bluntly and the peritoneum was entered bluntly. Attention was turned to the lower uterine segment where a low transverse hysterotomy was made with a scalpel and extended bilaterally bluntly.  The infant was successfully delivered, the cord was clamped and cut and the infant was handed over to awaiting neonatology team. Uterine massage was then administered, and the placenta delivered intact with a three-vessel cord. The uterus was then cleared of clot and debris.  The hysterotomy was closed with 0 Monocryl in a running locked fashion, and an imbricating layer was also placed with a 0 Monocryl suture. Attention was then turned to the fallopian tubes.  A Filshie clip was placed on both tubes, about 3 cm from the cornua, with care given to incorporate the underlying  mesosalpinx on both sides,  allowing for bilateral tubal sterilization. The pelvis was cleared of all clot and debris. Hemostasis was confirmed on all surfaces.  The peritoneum and the muscles were reapproximated using 0 Monocryl interrupted stitches. The fascia was then closed using 0 Vicryl in a running fashion.  The subcutaneous layer was irrigated, and the skin was closed with a 4-0 Vicryl subcuticular stitch. The patient tolerated the procedure well. Sponge, lap, instrument and needle counts were correct x 2.  She was taken to the recovery room in stable condition.

## 2012-04-01 NOTE — MAU Note (Signed)
Pt reports contractions q 6 minutes.

## 2012-04-01 NOTE — Addendum Note (Signed)
Addendum  created 04/01/12 1359 by Renford Dills, CRNA   Modules edited:Notes Section

## 2012-04-01 NOTE — Transfer of Care (Signed)
Immediate Anesthesia Transfer of Care Note  Patient: Joan Peterson  Procedure(s) Performed: Procedure(s) (LRB): CESAREAN SECTION WITH BILATERAL TUBAL LIGATION (N/A)  Patient Location: PACU  Anesthesia Type: Spinal  Level of Consciousness: awake, alert  and oriented  Airway & Oxygen Therapy: Patient Spontanous Breathing  Post-op Assessment: Report given to PACU RN  Post vital signs: Reviewed and stable  Complications: No apparent anesthesia complications

## 2012-04-01 NOTE — MAU Provider Note (Signed)
  History     CSN: [id number]  Arrival date and time: 04/01/12 4098   None     Chief Complaint  Patient presents with  . Labor Eval   HPI Patient is a G3P2002 at 20.3 EGA who presents for labor evaluation.  State she started contracting around 1 am.  Contractions were 10 minutes apart at that time.  They have become more frequent. She denies loss of fluid.  States she noticed some blood mixed with mucus.  She was checked yesterday and her cervix was 4 cm.  She states the baby is moving normally.  OB History    Grav Para Term Preterm Abortions TAB SAB Ect Mult Living   3 2 2       2       Past Medical History  Diagnosis Date  . Urinary tract infection   . HSV-2 infection     Past Surgical History  Procedure Date  . No past surgeries     Family History  Problem Relation Age of Onset  . Hypertension Father   . Hearing loss Father     History  Substance Use Topics  . Smoking status: Current Everyday Smoker -- 0.2 packs/day  . Smokeless tobacco: Not on file  . Alcohol Use: No    Allergies:  Allergies  Allergen Reactions  . Ibuprofen Hives  . Penicillins Hives    Prescriptions prior to admission  Medication Sig Dispense Refill  . ACYCLOVIR PO Take by mouth.      Marland Kitchen acetaminophen (TYLENOL) 500 MG tablet Take 500 mg by mouth every 6 (six) hours as needed. Pain        . prenatal vitamin w/FE, FA (PRENATAL 1 + 1) 27-1 MG TABS Take 1 tablet by mouth daily.          Review of Systems  Constitutional: Negative for fever and chills.  Eyes: Negative for blurred vision.  Respiratory: Negative for shortness of breath.   Cardiovascular: Negative for chest pain.  Neurological: Negative for dizziness and headaches.   Physical Exam   Blood pressure 111/71, pulse 94, temperature 98.2 F (36.8 C), temperature source Oral, resp. rate 20, height 5\' 4"  (1.626 m), weight 116.121 kg (256 lb), last menstrual period 06/17/2011, SpO2 99.00%.  Physical Exam  Constitutional: She  appears well-developed and well-nourished.  HENT:  Head: Normocephalic and atraumatic.  Cardiovascular: Normal rate, regular rhythm and normal heart sounds.   Respiratory: Effort normal and breath sounds normal.  GI: Soft. There is no tenderness.       Gravid abdomen  Genitourinary:       Cervix: 5.5/80/-3    MAU Course  Procedures Assessment and Plan  Patient is a G3P2002 at 39.3 EGA who presents for labor evaluation.  Appears to be in labor given cervical change and onset of contractions. Will admit to birthing suites. GBS positive, vancomycn for this given penicillin allergy and no sensativities.  Marikay Alar 04/01/2012, 7:22 AM   I have seen and examined this patient and I agree with the above. Cam Hai 8:02 AM 04/01/2012

## 2012-04-01 NOTE — Anesthesia Procedure Notes (Signed)
Spinal  Patient location during procedure: OR Start time: 04/01/2012 10:24 AM Staffing Performed by: anesthesiologist  Preanesthetic Checklist Completed: patient identified, site marked, surgical consent, pre-op evaluation, timeout performed, IV checked, risks and benefits discussed and monitors and equipment checked Spinal Block Patient position: sitting Prep: site prepped and draped and DuraPrep Patient monitoring: heart rate, cardiac monitor, continuous pulse ox and blood pressure Approach: midline Location: L3-4 Injection technique: single-shot Needle Needle type: Sprotte  Needle gauge: 24 G Needle length: 9 cm Assessment Sensory level: T4 Additional Notes Clear free flow CSF on first attempt.  No paresthesia.  Patient tolerated procedure well.  Jasmine December, MD

## 2012-04-01 NOTE — Progress Notes (Signed)
Attestation of Attending Supervision of Resident: Evaluation and management procedures were performed by the [facility] Resident under my supervision.  I have seen and examined the patient, reviewed the resident's note and chart, and I agree with the management and plan.   Jaynie Collins, M.D. 04/01/2012 1:15 PM

## 2012-04-01 NOTE — H&P (Signed)
Joan Peterson is a 26 y.o. female presenting for spontaneous onset of labor. Patient is a G3P2002 at 57.3 EGA who presents for labor evaluation.  State she started contracting around 1 am.  Contractions were 10 minutes apart at that time.  They have become more frequent. She denies loss of fluid.  States she noticed some blood mixed with mucus.  She was checked yesterday and her cervix was 4 cm.  She states the baby is moving normally. Maternal Medical History:  Reason for admission: Reason for admission: contractions.  Contractions: Onset was 6-12 hours ago.   Frequency: irregular.   Perceived severity is moderate.    Fetal activity: Perceived fetal activity is normal.   Last perceived fetal movement was within the past hour.    Prenatal Complications - Diabetes: none.    OB History    Grav Para Term Preterm Abortions TAB SAB Ect Mult Living   3 2 2       2      Past Medical History  Diagnosis Date  . Urinary tract infection   . HSV-2 infection    Past Surgical History  Procedure Date  . No past surgeries    Family History: family history includes Hearing loss in her father and Hypertension in her father. Social History:  reports that she has been smoking.  She does not have any smokeless tobacco history on file. She reports that she does not drink alcohol or use illicit drugs.   Prenatal Transfer Tool  Maternal Diabetes: No Genetic Screening: Declined Maternal Ultrasounds/Referrals: Normal Fetal Ultrasounds or other Referrals:  None Maternal Substance Abuse:  No Significant Maternal Medications:  Meds include: Other: see prenatal record Significant Maternal Lab Results:  Lab values include: Group B Strep positive Other Comments:  None  Review of Systems  Constitutional: Negative for fever and chills.  Eyes: Negative for blurred vision.  Respiratory: Negative for shortness of breath.   Cardiovascular: Negative for chest pain.  Neurological: Negative for dizziness  and headaches.    Dilation: 5.5 Effacement (%): 80 Station: -3 Exam by:: Wynonia Musty MD Blood pressure 112/63, pulse 90, temperature 97.6 F (36.4 C), temperature source Oral, resp. rate 20, height 5\' 4"  (1.626 m), weight 116.121 kg (256 lb), last menstrual period 06/17/2011, SpO2 99.00%. Exam Physical Exam  Constitutional: She is oriented to person, place, and time. She appears well-developed and well-nourished.  HENT:  Head: Normocephalic and atraumatic.  Cardiovascular: Normal rate, regular rhythm and normal heart sounds.   Respiratory: Effort normal and breath sounds normal.  GI: Soft. There is no tenderness.       Gravid abdomen  Genitourinary:       Cervix: 5.5/80/-3  Neurological: She is alert and oriented to person, place, and time.  Psychiatric: She has a normal mood and affect.    Prenatal labs: ABO, Rh: --/--/O POS (12/01 1102) Antibody:   negative Rubella:   immune RPR:    HBsAg:   negative HIV:   non-reactive GBS:   positive  HSV II positive, treated with valtrex, no recent outbreaks  Assessment/Plan: Patient is a G3P2002 at 65.3 EGA who presents for spontaneous onset of labor.  Admit to birthing suite. Vancomycin for GBS positive given penicillin allergy. Will support through labor.  Marikay Alar 04/01/2012, 7:46 AM     FHT: 150, minimal, accels absent, decels absent, contractions every 3-4 minutes  I have seen and examined this patient and I agree with the above. Clelia Croft, KIMBERLY 8:05 AM 04/01/2012

## 2012-04-01 NOTE — Anesthesia Postprocedure Evaluation (Signed)
Anesthesia Post Note  Patient: Joan Peterson  Procedure(s) Performed: Procedure(s) (LRB): CESAREAN SECTION WITH BILATERAL TUBAL LIGATION (N/A)  Anesthesia type: Spinal  Patient location: PACU  Post pain: Pain level controlled  Post assessment: Post-op Vital signs reviewed  Last Vitals:  Filed Vitals:   04/01/12 1200  BP: 111/50  Pulse: 80  Temp:   Resp: 16    Post vital signs: Reviewed  Level of consciousness: awake  Complications: No apparent anesthesia complications

## 2012-04-02 LAB — CBC
HCT: 25.5 % — ABNORMAL LOW (ref 36.0–46.0)
MCV: 86.1 fL (ref 78.0–100.0)
Platelets: 230 10*3/uL (ref 150–400)
RBC: 2.96 MIL/uL — ABNORMAL LOW (ref 3.87–5.11)
WBC: 10.5 10*3/uL (ref 4.0–10.5)

## 2012-04-02 NOTE — Progress Notes (Signed)
UR chart review completed.  

## 2012-04-02 NOTE — Progress Notes (Signed)
Subjective: Postpartum Day 1: Cesarean Delivery and BTL  Patient reports no problems voiding. She is hungry and looking forward to eating breakfast. Pt is up ad lib. She has been walking to the bathroom and around her room this morning. Denies weakness, lightheadedness, fatigue, and shortness of breath. Denies pain, controlled with Tylenol. Some pelvic tenderness when moving around and standing. Well-tolerated.  Objective: Vital signs in last 24 hours: Temp:  [97.2 F (36.2 C)-98.2 F (36.8 C)] 98.2 F (36.8 C) (07/25 0435) Pulse Rate:  [77-105] 90  (07/25 0435) Resp:  [16-20] 16  (07/25 0435) BP: (98-134)/(47-72) 118/72 mmHg (07/25 0435) SpO2:  [95 %-100 %] 99 % (07/25 0435)  Physical Exam:  General: alert, cooperative and no distress. Pt is upbeat and pleasant. Lochia: appropriate, minimal Uterine Fundus: firm, tender to palpation Incision: dressing changed this morning, is dry and clean. DVT Evaluation: Negative Homan's sign. No cords or calf tenderness. No significant calf/ankle edema.   Basename 04/02/12 0550 04/01/12 0805  HGB 8.3* 10.5*  HCT 25.5* 32.2*    Assessment/Plan: Status post Cesarean section and BTL. Doing well postoperatively.  Continue current care. Pt indicated she would like to be discharged tomorrow and understands baby must be checked by pediatrician first.  Mother is bottle feeding.  Golden Circle 04/02/2012, 7:27 AM

## 2012-04-02 NOTE — Progress Notes (Signed)
I examined pt and agree with documentation above and PA-S plan of care. Joan Peterson,Joan Peterson  

## 2012-04-03 MED ORDER — SENNOSIDES-DOCUSATE SODIUM 8.6-50 MG PO TABS: 2.0000 | ORAL_TABLET | Freq: Every day | ORAL | Status: AC

## 2012-04-03 MED ORDER — OXYCODONE-ACETAMINOPHEN 5-325 MG PO TABS: 1.0000 | ORAL_TABLET | ORAL | Status: AC | PRN

## 2012-04-03 NOTE — Discharge Summary (Addendum)
Obstetric Discharge Summary Reason for Admission: Onset of labor with active herpes outbreak Prenatal Procedures: none Intrapartum Procedures: cesarean: low cervical, transverse Postpartum Procedures: none Complications-Operative and Postpartum: none Hemoglobin  Date Value Range Status  04/02/2012 8.3* 12.0 - 15.0 g/dL Final     DELTA CHECK NOTED     REPEATED TO VERIFY     HCT  Date Value Range Status  04/02/2012 25.5* 36.0 - 46.0 % Final    Physical Exam:  See progress note for today.  Discharge Diagnoses: Term Pregnancy-delivered  Discharge Information: Date: 04/03/2012 Activity: pelvic rest Diet: routine Medications: PNV, Colace and Percocet Condition: stable Instructions: refer to practice specific booklet Discharge to: home Follow-up Information    Follow up with [facility] in 1 week.         Newborn Data: Live born female  Birth Weight: 8 lb 3 oz (3714 g) APGAR: 9, 9  Patient will remain inpatient.  Joan Peterson JEHIEL 04/03/2012, 10:59 AM

## 2012-04-03 NOTE — Progress Notes (Signed)
Post=Op day #2: C/S and BTL Subjective: no complaints, up ad lib, voiding and tolerating PO, small lochia, plans to bottle feed, bilateral tubal ligation.  May be interested in discharge this evening  Objective: Blood pressure 101/64, pulse 87, temperature 97.8 F (36.6 C), temperature source Oral, resp. rate 18, height 5\' 4"  (1.626 m), weight 116.121 kg (256 lb), last menstrual period 06/17/2011, SpO2 99.00%, unknown if currently breastfeeding.  Physical Exam:  General: alert, cooperative and no distress Lochia:normal flow Chest: CTAB Heart: RRR no m/r/g Abdomen: +BS, soft, nontender, incision clean and dry without erythema Uterine Fundus: firm DVT Evaluation: No evidence of DVT seen on physical exam. Extremities: 1+ edema   Basename 04/02/12 0550 04/01/12 0805  HGB 8.3* 10.5*  HCT 25.5* 32.2*    Assessment/Plan: Plan for discharge tomorrow   LOS: 2 days   CRESENZO-DISHMAN,Tennelle Taflinger 04/03/2012, 6:47 AM

## 2012-04-04 ENCOUNTER — Encounter (HOSPITAL_COMMUNITY): Payer: Self-pay | Admitting: *Deleted

## 2012-04-05 LAB — TYPE AND SCREEN
ABO/RH(D): O POS
Antibody Screen: NEGATIVE
Unit division: 0

## 2012-04-11 ENCOUNTER — Emergency Department (HOSPITAL_COMMUNITY)
Admission: EM | Admit: 2012-04-11 | Discharge: 2012-04-11 | Disposition: A | Discharge status: Home / Self Care | Payer: Medicaid Other | Attending: Emergency Medicine | Admitting: Emergency Medicine

## 2012-04-11 ENCOUNTER — Encounter (HOSPITAL_COMMUNITY): Payer: Self-pay | Admitting: *Deleted

## 2012-04-11 DIAGNOSIS — J020 Streptococcal pharyngitis: Secondary | ICD-10-CM | POA: Insufficient documentation

## 2012-04-11 DIAGNOSIS — F17200 Nicotine dependence, unspecified, uncomplicated: Secondary | ICD-10-CM | POA: Insufficient documentation

## 2012-04-11 MED ORDER — AZITHROMYCIN 250 MG PO TABS
500.0000 mg | ORAL_TABLET | Freq: Once | ORAL | Status: AC
  Administered 2012-04-11: 500 mg via ORAL

## 2012-04-11 MED ORDER — AZITHROMYCIN 250 MG PO TABS: 250.0000 mg | ORAL_TABLET | Freq: Every day | ORAL | Status: AC

## 2012-04-11 MED ORDER — IBUPROFEN 800 MG PO TABS: 800.0000 mg | ORAL_TABLET | Freq: Once | ORAL | Status: DC

## 2012-04-11 MED ORDER — ACETAMINOPHEN 500 MG PO TABS
1000.0000 mg | ORAL_TABLET | Freq: Once | ORAL | Status: AC
  Administered 2012-04-11: 1000 mg via ORAL
  Filled 2012-04-11: qty 2

## 2012-04-11 MED ORDER — AZITHROMYCIN 250 MG PO TABS
ORAL_TABLET | ORAL | Status: AC
  Filled 2012-04-11: qty 2

## 2012-04-11 NOTE — ED Provider Notes (Signed)
History     CSN: [id number]  Arrival date & time 04/11/12  1124   First MD Initiated Contact with Patient 04/11/12 1132      Chief Complaint  Patient presents with  . Sore Throat    (Consider location/radiation/quality/duration/timing/severity/associated sxs/prior treatment) Patient is a 26 y.o. female presenting with pharyngitis. The history is provided by the patient.  Sore Throat This is a new problem. Episode onset: 2 days ago. The problem occurs constantly. The problem has been gradually worsening. Associated symptoms include congestion, a sore throat and swollen glands. Pertinent negatives include no abdominal pain, arthralgias, chest pain, coughing, fever, headaches, joint swelling, myalgias, nausea, neck pain, numbness, rash or weakness. The symptoms are aggravated by swallowing (she reports she has been able to swallow liquids,  but solid food has been increasingly painful.). Treatments tried: otc sinus medicine. The treatment provided no relief.    Past Medical History  Diagnosis Date  . Urinary tract infection   . HSV-2 infection     Past Surgical History  Procedure Date  . Cesarean section   . Tubal ligation   . No past surgeries     Family History  Problem Relation Age of Onset  . Hypertension Father   . Hearing loss Father     History  Substance Use Topics  . Smoking status: Current Everyday Smoker -- 0.2 packs/day  . Smokeless tobacco: Not on file  . Alcohol Use: No    OB History    Grav Para Term Preterm Abortions TAB SAB Ect Mult Living   3 3 3       3       Review of Systems  Constitutional: Negative for fever.  HENT: Positive for congestion, sore throat and trouble swallowing. Negative for neck pain.   Eyes: Negative.   Respiratory: Negative for cough, chest tightness and shortness of breath.   Cardiovascular: Negative for chest pain.  Gastrointestinal: Negative for nausea and abdominal pain.  Genitourinary: Negative.   Musculoskeletal:  Negative for myalgias, joint swelling and arthralgias.  Skin: Negative.  Negative for rash and wound.  Neurological: Negative for dizziness, weakness, light-headedness, numbness and headaches.  Hematological: Negative.   Psychiatric/Behavioral: Negative.     Allergies  Ibuprofen and Penicillins  Home Medications   Current Outpatient Rx  Name Route Sig Dispense Refill  . ACYCLOVIR PO Oral Take 1 tablet by mouth every morning.    . OXYCODONE-ACETAMINOPHEN 5-325 MG PO TABS Oral Take 1-2 tablets by mouth every 4 (four) hours as needed (moderate - severe pain). 30 tablet 0  . PRENATAL MULTIVITAMIN CH Oral Take 1 tablet by mouth daily.    Bernadette Hoit SODIUM 8.6-50 MG PO TABS Oral Take 2 tablets by mouth at bedtime. 30 tablet 1    BP 107/73  Pulse 100  Temp 98.3 F (36.8 C) (Oral)  Resp 16  SpO2 100%  LMP 06/17/2011  Breastfeeding? Unknown  Physical Exam  Constitutional: She is oriented to person, place, and time. She appears well-developed and well-nourished.  HENT:  Head: Normocephalic and atraumatic.  Right Ear: Tympanic membrane and ear canal normal.  Left Ear: Tympanic membrane and ear canal normal.  Nose: No mucosal edema or rhinorrhea.  Mouth/Throat: Uvula is midline and mucous membranes are normal. Posterior oropharyngeal erythema present. No oropharyngeal exudate, posterior oropharyngeal edema or tonsillar abscesses.  Eyes: Conjunctivae are normal.  Pulmonary/Chest: Effort normal. No respiratory distress. She has no wheezes. She has no rales.  Abdominal: Soft. There is no tenderness.  Musculoskeletal: Normal range of motion.  Neurological: She is alert and oriented to person, place, and time.  Skin: Skin is warm and dry. No rash noted.  Psychiatric: She has a normal mood and affect.    ED Course  Procedures (including critical care time)  Labs Reviewed  RAPID STREP SCREEN - Abnormal; Notable for the following:    Streptococcus, Group A Screen (Direct)  POSITIVE (*)     All other components within normal limits   No results found.   1. Strep throat       MDM  Pt given zithromax 500 mg po.  Prescription for #4 250 mg tabs given,  Tylenol,  Warm salt gargles.  Increased fluids,  Rest.  Also given masks to use at home, frequent hand washing.  Pt with newborn who is scheduled to see pediatrician in 3 days for routine check.        Burgess Amor, PA 04/11/12 1227  Burgess Amor, PA 04/12/12 1037

## 2012-04-11 NOTE — ED Notes (Signed)
C/o sore throat and coughing since Thursday, denies fever, tonsils are swollen

## 2012-04-11 NOTE — ED Notes (Signed)
Sore throat began Thursday. Pt states it hurts to swallow food, able to swallow liquids fine, per pt.

## 2012-04-11 NOTE — ED Notes (Signed)
J. Idol, PA at bedside. 

## 2012-04-16 NOTE — ED Provider Notes (Signed)
Medical screening examination/treatment/procedure(s) were performed by non-physician practitioner and as supervising physician I was immediately available for consultation/collaboration.   Joya Gaskins, MD 04/16/12 [id number]

## 2014-07-11 ENCOUNTER — Encounter (HOSPITAL_COMMUNITY): Payer: Self-pay | Admitting: *Deleted

## 2021-02-26 ENCOUNTER — Emergency Department (HOSPITAL_COMMUNITY)
Admission: EM | Admit: 2021-02-26 | Discharge: 2021-02-26 | Disposition: A | Discharge status: Home / Self Care | Payer: Medicaid Other | Attending: Emergency Medicine | Admitting: Emergency Medicine

## 2021-02-26 ENCOUNTER — Other Ambulatory Visit: Payer: Self-pay

## 2021-02-26 ENCOUNTER — Emergency Department (HOSPITAL_COMMUNITY): Payer: Medicaid Other

## 2021-02-26 ENCOUNTER — Encounter (HOSPITAL_COMMUNITY): Payer: Self-pay | Admitting: *Deleted

## 2021-02-26 DIAGNOSIS — R42 Dizziness and giddiness: Secondary | ICD-10-CM | POA: Diagnosis present

## 2021-02-26 DIAGNOSIS — R000 Tachycardia, unspecified: Secondary | ICD-10-CM | POA: Diagnosis not present

## 2021-02-26 DIAGNOSIS — F17210 Nicotine dependence, cigarettes, uncomplicated: Secondary | ICD-10-CM | POA: Insufficient documentation

## 2021-02-26 DIAGNOSIS — Z20822 Contact with and (suspected) exposure to covid-19: Secondary | ICD-10-CM | POA: Diagnosis not present

## 2021-02-26 DIAGNOSIS — J069 Acute upper respiratory infection, unspecified: Secondary | ICD-10-CM | POA: Diagnosis not present

## 2021-02-26 DIAGNOSIS — R197 Diarrhea, unspecified: Secondary | ICD-10-CM | POA: Diagnosis not present

## 2021-02-26 LAB — CBC WITH DIFFERENTIAL/PLATELET
Abs Immature Granulocytes: 0.07 10*3/uL (ref 0.00–0.07)
Basophils Absolute: 0 10*3/uL (ref 0.0–0.1)
Basophils Relative: 0 %
Eosinophils Absolute: 0 10*3/uL (ref 0.0–0.5)
Eosinophils Relative: 0 %
HCT: 38 % (ref 36.0–46.0)
Hemoglobin: 11.5 g/dL — ABNORMAL LOW (ref 12.0–15.0)
Immature Granulocytes: 1 %
Lymphocytes Relative: 13 %
Lymphs Abs: 0.7 10*3/uL (ref 0.7–4.0)
MCH: 24.8 pg — ABNORMAL LOW (ref 26.0–34.0)
MCHC: 30.3 g/dL (ref 30.0–36.0)
MCV: 81.9 fL (ref 80.0–100.0)
Monocytes Absolute: 0.6 10*3/uL (ref 0.1–1.0)
Monocytes Relative: 10 %
Neutro Abs: 4.4 10*3/uL (ref 1.7–7.7)
Neutrophils Relative %: 76 %
Platelets: 229 10*3/uL (ref 150–400)
RBC: 4.64 MIL/uL (ref 3.87–5.11)
RDW: 15.1 % (ref 11.5–15.5)
WBC: 5.8 10*3/uL (ref 4.0–10.5)
nRBC: 0 % (ref 0.0–0.2)

## 2021-02-26 LAB — COMPREHENSIVE METABOLIC PANEL
ALT: 33 U/L (ref 0–44)
AST: 40 U/L (ref 15–41)
Albumin: 3.6 g/dL (ref 3.5–5.0)
Alkaline Phosphatase: 80 U/L (ref 38–126)
Anion gap: 9 (ref 5–15)
BUN: 10 mg/dL (ref 6–20)
CO2: 24 mmol/L (ref 22–32)
Calcium: 8.4 mg/dL — ABNORMAL LOW (ref 8.9–10.3)
Chloride: 99 mmol/L (ref 98–111)
Creatinine, Ser: 1.3 mg/dL — ABNORMAL HIGH (ref 0.44–1.00)
GFR, Estimated: 55 mL/min — ABNORMAL LOW (ref >60)
Glucose, Bld: 111 mg/dL — ABNORMAL HIGH (ref 70–99)
Potassium: 3.6 mmol/L (ref 3.5–5.1)
Sodium: 132 mmol/L — ABNORMAL LOW (ref 135–145)
Total Bilirubin: 0.5 mg/dL (ref 0.3–1.2)
Total Protein: 7.9 g/dL (ref 6.5–8.1)

## 2021-02-26 LAB — RESP PANEL BY RT-PCR (FLU A&B, COVID) ARPGX2
Influenza A by PCR: NEGATIVE
Influenza B by PCR: NEGATIVE
SARS Coronavirus 2 by RT PCR: NEGATIVE

## 2021-02-26 MED ORDER — SODIUM CHLORIDE 0.9 % IV BOLUS
1000.0000 mL | Freq: Once | INTRAVENOUS | Status: AC
  Administered 2021-02-26: 1000 mL via INTRAVENOUS

## 2021-02-26 MED ORDER — ACETAMINOPHEN 325 MG PO TABS
650.0000 mg | ORAL_TABLET | Freq: Once | ORAL | Status: AC
  Administered 2021-02-26: 650 mg via ORAL
  Filled 2021-02-26: qty 2

## 2021-02-26 NOTE — ED Triage Notes (Signed)
Dizziness for 2 days.

## 2021-02-26 NOTE — Discharge Instructions (Signed)
Suspect you are suffering from a viral illness, will recommend Tylenol for fever control, ibuprofen for pain control.  Please stay hydrated as you are dehydrated my exam.  If symptoms do not improve in 1 week's time please follow-up with your PCP for further evaluation.  Come back to the emergency department if you develop chest pain, shortness of breath, severe abdominal pain, uncontrolled nausea, vomiting, diarrhea.

## 2021-02-26 NOTE — ED Provider Notes (Signed)
[facility] EMERGENCY DEPARTMENT Provider Note   CSN: [id number] Arrival date & time: 02/26/21  1533     History Chief Complaint  Patient presents with   Dizziness    Joan Peterson is a 35 y.o. female.  HPI  Patient with no significant medical history presents with dizziness x3 days.  Patient states she developed dizziness 3 days ago, states it is worsened when she goes from a sitting to standing position or when she is walking, describes the dizziness as the room is spinning, she denies hitting her head, losing conscious, is not on anticoagulant.  She denies associated headaches, change in vision, paresthesias or weakness the upper lower extremities.  Patient states that she has been having fevers and chills for the last few days, as well as having diarrhea, she denies seeing hematochezia, or melena, she denies urinary symptoms, abdominal pain, nausea, vomiting, tolerating p.o. without difficulty.  She denies recent sick contacts, is not up-to-date on her COVID or her influenza shot.  Patient states she had a sore throat 1 week ago but this is since resolved.  She denies any alleviating factors.  Past Medical History:  Diagnosis Date   HSV-2 infection    Urinary tract infection     Patient Active Problem List   Diagnosis Date Noted   Gestational age, 14 weeks 04/01/2012   Single liveborn, born in hospital, delivered by cesarean delivery 04/01/2012   Pregnancy 03/23/2012    Past Surgical History:  Procedure Laterality Date   CESAREAN SECTION     NO PAST SURGERIES     TUBAL LIGATION       OB History     Gravida  3   Para  3   Term  3   Preterm      AB      Living  3      SAB      IAB      Ectopic      Multiple      Live Births  1           Family History  Problem Relation Age of Onset   Hypertension Father    Hearing loss Father     Social History   Tobacco Use   Smoking status: Every Day    Packs/day: 0.25    Pack years: 0.00     Types: Cigarettes  Substance Use Topics   Alcohol use: No   Drug use: No    Home Medications Prior to Admission medications   Medication Sig Start Date End Date Taking? Authorizing Provider  ACYCLOVIR PO Take 1 tablet by mouth every morning.    [provider]  Prenatal Vit-Fe Fumarate-FA (PRENATAL MULTIVITAMIN) TABS Take 1 tablet by mouth daily.    [provider]    Allergies    Ibuprofen and Penicillins  Review of Systems   Review of Systems  Constitutional:  Positive for chills and fever.  HENT:  Positive for congestion. Negative for sore throat.   Respiratory:  Negative for shortness of breath.   Cardiovascular:  Negative for chest pain.  Gastrointestinal:  Positive for diarrhea. Negative for abdominal pain, nausea and vomiting.  Genitourinary:  Negative for enuresis.  Musculoskeletal:  Negative for back pain.  Skin:  Negative for rash.  Neurological:  Positive for dizziness. Negative for headaches.  Hematological:  Does not bruise/bleed easily.   Physical Exam Updated Vital Signs BP 120/61   Pulse 94   Temp (!) 101 F (  38.3 C) (Oral)   Resp 18   Ht 5\' 2"  (1.575 m)   Wt 117.9 kg   LMP  (LMP Unknown)   SpO2 98%   BMI 47.55 kg/m   Physical Exam Vitals and nursing note reviewed.  Constitutional:      General: She is not in acute distress.    Appearance: She is not ill-appearing.  HENT:     Head: Normocephalic and atraumatic.     Right Ear: Tympanic membrane normal.     Left Ear: Tympanic membrane normal.     Nose: Congestion present.     Comments: Patient has erythematous turbinates Eyes:     Conjunctiva/sclera: Conjunctivae normal.  Cardiovascular:     Rate and Rhythm: Regular rhythm. Tachycardia present.     Pulses: Normal pulses.     Heart sounds: No murmur heard.   No friction rub. No gallop.  Pulmonary:     Effort: No respiratory distress.     Breath sounds: No wheezing, rhonchi or rales.  Abdominal:     Palpations: Abdomen is  soft.     Tenderness: There is no abdominal tenderness. There is no right CVA tenderness or left CVA tenderness.  Musculoskeletal:     Right lower leg: No edema.     Left lower leg: No edema.  Skin:    General: Skin is warm and dry.  Neurological:     Mental Status: She is alert.  Psychiatric:        Mood and Affect: Mood normal.    ED Results / Procedures / Treatments   Labs (all labs ordered are listed, but only abnormal results are displayed) Labs Reviewed  COMPREHENSIVE METABOLIC PANEL - Abnormal; Notable for the following components:      Result Value   Sodium 132 (*)    Glucose, Bld 111 (*)    Creatinine, Ser 1.30 (*)    Calcium 8.4 (*)    GFR, Estimated 55 (*)    All other components within normal limits  CBC WITH DIFFERENTIAL/PLATELET - Abnormal; Notable for the following components:   Hemoglobin 11.5 (*)    MCH 24.8 (*)    All other components within normal limits  RESP PANEL BY RT-PCR (FLU A&B, COVID) [id number]    EKG None  Radiology DG Chest Port 1 View  Result Date: 02/26/2021 CLINICAL DATA:  35 year old female with cough and shortness of breath. EXAM: PORTABLE CHEST 1 VIEW COMPARISON:  None. FINDINGS: No focal consolidation, pleural effusion or pneumothorax. The cardiac silhouette is within limits. No acute osseous pathology. IMPRESSION: No active disease. Electronically Signed   By: 20 M.D.   On: 02/26/2021 17:57    Procedures Procedures   Medications Ordered in ED Medications  sodium chloride 0.9 % bolus 1,000 mL (0 mLs Intravenous Stopped 02/26/21 1843)  acetaminophen (TYLENOL) tablet 650 mg (650 mg Oral Given 02/26/21 1751)    ED Course  I have reviewed the triage vital signs and the nursing notes.  Pertinent labs & imaging results that were available during my care of the patient were reviewed by me and considered in my medical decision making (see chart for details).    MDM Rules/Calculators/A&P                         Initial  impression-patient presents with dizziness and URI-like symptoms.  She is alert, does not appear in acute distress, vital signs noted for tachycardia.  Will obtain basic lab work-up,  chest x-ray, EKG, provide patient with fluids.   Work-up-CBC shows normocytic anemia with a hemoglobin 11.5, CMP shows hyponatremia 132, hyperglycemia 111, creatinine 1.3, GFR 55, respiratory panel negative.  Chest x-ray unremarkable,  Reassessment-patient was reassessed after fluids, Tylenol she states she is feeling much better, has no complaints this time.  Heart rate has improved, fevers trending down.  Patient is agreeable for discharge at this time.  Rule out-low suspicion for CVA or intracranial head bleed as patient denies headaches, change in vision, paresthesias or weakness in the upper or  lower extremities, there is no focal deficits present on exam.  Suspect dizziness is secondary due to dehydration as she appears to be slightly dehydrated.  Low suspicion for systemic infection as patient is nontoxic-appearing, no leukocytosis seen on CBC.  Patient was noted to be tachycardic with a fever on arrival but I suspect this is due to a viral infection, this improved after fluids and Tylenol. Low suspicion for pneumonia as lung sounds are clear bilaterally, x-ray did not reveal any acute findings.  I have low suspicion for PE as patient denies pleuritic chest pain, shortness of breath, vital signs reassuring.  Low suspicion for strep throat as oropharynx was visualized, no erythema or exudates noted.  Low suspicion patient would need  hospitalized due to viral infection or Covid as vital signs reassuring, patient is not in respiratory distress.    Plan-  Suspect patient suffering from a viral illness.  Will recommend rehydration, over-the-counter pain medication, follow-up PCP as needed.  Vital signs have remained stable, no indication for hospital admission.  Patient given at home care as well strict return  precautions.  Patient verbalized that they understood agreed to said plan.  Final Clinical Impression(s) / ED Diagnoses Final diagnoses:  Dizziness  Viral upper respiratory tract infection    Rx / DC Orders ED Discharge Orders     None        Carroll Sage, PA-C 02/26/21 1933    Pricilla Loveless, MD 03/01/21 [phone]

## 2021-02-28 ENCOUNTER — Emergency Department (HOSPITAL_COMMUNITY)
Admission: EM | Admit: 2021-02-28 | Discharge: 2021-02-28 | Disposition: A | Discharge status: Home / Self Care | Payer: Medicaid Other | Attending: Emergency Medicine | Admitting: Emergency Medicine

## 2021-02-28 ENCOUNTER — Other Ambulatory Visit: Payer: Self-pay

## 2021-02-28 ENCOUNTER — Encounter (HOSPITAL_COMMUNITY): Payer: Self-pay

## 2021-02-28 ENCOUNTER — Emergency Department (HOSPITAL_COMMUNITY): Payer: Medicaid Other

## 2021-02-28 DIAGNOSIS — E860 Dehydration: Secondary | ICD-10-CM | POA: Insufficient documentation

## 2021-02-28 DIAGNOSIS — F17210 Nicotine dependence, cigarettes, uncomplicated: Secondary | ICD-10-CM | POA: Insufficient documentation

## 2021-02-28 DIAGNOSIS — R55 Syncope and collapse: Secondary | ICD-10-CM | POA: Diagnosis present

## 2021-02-28 DIAGNOSIS — R197 Diarrhea, unspecified: Secondary | ICD-10-CM | POA: Insufficient documentation

## 2021-02-28 DIAGNOSIS — R10819 Abdominal tenderness, unspecified site: Secondary | ICD-10-CM | POA: Insufficient documentation

## 2021-02-28 LAB — HCG, QUANTITATIVE, PREGNANCY: hCG, Beta Chain, Quant, S: <1 m[IU]/mL (ref <5)

## 2021-02-28 LAB — COMPREHENSIVE METABOLIC PANEL
ALT: 43 U/L (ref 0–44)
AST: 74 U/L — ABNORMAL HIGH (ref 15–41)
Albumin: 3.1 g/dL — ABNORMAL LOW (ref 3.5–5.0)
Alkaline Phosphatase: 66 U/L (ref 38–126)
Anion gap: 8 (ref 5–15)
BUN: 12 mg/dL (ref 6–20)
CO2: 22 mmol/L (ref 22–32)
Calcium: 7.5 mg/dL — ABNORMAL LOW (ref 8.9–10.3)
Chloride: 104 mmol/L (ref 98–111)
Creatinine, Ser: 1.44 mg/dL — ABNORMAL HIGH (ref 0.44–1.00)
GFR, Estimated: 49 mL/min — ABNORMAL LOW (ref >60)
Glucose, Bld: 119 mg/dL — ABNORMAL HIGH (ref 70–99)
Potassium: 3.2 mmol/L — ABNORMAL LOW (ref 3.5–5.1)
Sodium: 134 mmol/L — ABNORMAL LOW (ref 135–145)
Total Bilirubin: 0.4 mg/dL (ref 0.3–1.2)
Total Protein: 7.1 g/dL (ref 6.5–8.1)

## 2021-02-28 LAB — CBC WITH DIFFERENTIAL/PLATELET
Abs Immature Granulocytes: 0.02 10*3/uL (ref 0.00–0.07)
Basophils Absolute: 0 10*3/uL (ref 0.0–0.1)
Basophils Relative: 0 %
Eosinophils Absolute: 0 10*3/uL (ref 0.0–0.5)
Eosinophils Relative: 0 %
HCT: 37.9 % (ref 36.0–46.0)
Hemoglobin: 11.2 g/dL — ABNORMAL LOW (ref 12.0–15.0)
Immature Granulocytes: 1 %
Lymphocytes Relative: 24 %
Lymphs Abs: 0.7 10*3/uL (ref 0.7–4.0)
MCH: 24.3 pg — ABNORMAL LOW (ref 26.0–34.0)
MCHC: 29.6 g/dL — ABNORMAL LOW (ref 30.0–36.0)
MCV: 82.4 fL (ref 80.0–100.0)
Monocytes Absolute: 0.1 10*3/uL (ref 0.1–1.0)
Monocytes Relative: 4 %
Neutro Abs: 2.1 10*3/uL (ref 1.7–7.7)
Neutrophils Relative %: 71 %
Platelets: 190 10*3/uL (ref 150–400)
RBC: 4.6 MIL/uL (ref 3.87–5.11)
RDW: 15.5 % (ref 11.5–15.5)
WBC: 2.9 10*3/uL — ABNORMAL LOW (ref 4.0–10.5)
nRBC: 0 % (ref 0.0–0.2)

## 2021-02-28 LAB — LACTIC ACID, PLASMA: Lactic Acid, Venous: 1.1 mmol/L (ref 0.5–1.9)

## 2021-02-28 MED ORDER — SODIUM CHLORIDE 0.9 % IV BOLUS
2000.0000 mL | Freq: Once | INTRAVENOUS | Status: AC
  Administered 2021-02-28: 2000 mL via INTRAVENOUS

## 2021-02-28 MED ORDER — CIPROFLOXACIN HCL 500 MG PO TABS: 500.0000 mg | ORAL_TABLET | Freq: Two times a day (BID) | ORAL | 0 refills | Status: DC

## 2021-02-28 MED ORDER — POTASSIUM CHLORIDE CRYS ER 20 MEQ PO TBCR: 40.0000 meq | EXTENDED_RELEASE_TABLET | Freq: Once | ORAL | Status: DC

## 2021-02-28 MED ORDER — IOHEXOL 300 MG/ML  SOLN
100.0000 mL | Freq: Once | INTRAMUSCULAR | Status: AC | PRN
  Administered 2021-02-28: 100 mL via INTRAVENOUS

## 2021-02-28 MED ORDER — POTASSIUM CHLORIDE CRYS ER 20 MEQ PO TBCR
40.0000 meq | EXTENDED_RELEASE_TABLET | Freq: Once | ORAL | Status: AC
  Administered 2021-02-28: 40 meq via ORAL
  Filled 2021-02-28: qty 2

## 2021-02-28 NOTE — ED Triage Notes (Signed)
EMS reports pt here on 6/20 for URI.  Reports not eating due to nausea.  Denies vomiting but reports diarrhea and intermittent fever.  Reports no longer has respiratory symptoms.  EMS Says was tested on previous visit and was negative for covid. C/o headache, nausea, diarrhea, and syncopal episode on the commode this morning.

## 2021-02-28 NOTE — ED Notes (Signed)
Patient is resting comfortably. 

## 2021-02-28 NOTE — ED Notes (Signed)
Patient transported to CT 

## 2021-02-28 NOTE — ED Notes (Signed)
Pt back from CT

## 2021-02-28 NOTE — Discharge Instructions (Addendum)
Drink plenty of fluids.  Follow-up with the gastroenterologist Dr.  Karilyn Cota next week

## 2021-02-28 NOTE — ED Provider Notes (Signed)
[facility] EMERGENCY DEPARTMENT Provider Note   CSN: [id number] Arrival date & time: 02/28/21  0846     History Chief Complaint  Patient presents with   Loss of Consciousness    Joan Peterson is a 35 y.o. female.  Patient has had diarrhea for a week and a half.  She had an episode where she passed out today on the commode.  She was seen in the ED a few days ago and was sent home.  She had a negative COVID test.  No blood in her diarrhea  The history is provided by the patient and medical records. No language interpreter was used.  Loss of Consciousness Episode history:  Single Most recent episode:  Today Timing:  Rare Progression:  Unchanged Chronicity:  New Context: not blood draw   Witnessed: no   Relieved by:  Nothing Worsened by:  Nothing Ineffective treatments:  None tried Associated symptoms: no anxiety, no chest pain, no headaches and no seizures   Risk factors: no congenital heart disease       Past Medical History:  Diagnosis Date   HSV-2 infection    Urinary tract infection     Patient Active Problem List   Diagnosis Date Noted   Gestational age, 59 weeks 04/01/2012   Single liveborn, born in hospital, delivered by cesarean delivery 04/01/2012   Pregnancy 03/23/2012    Past Surgical History:  Procedure Laterality Date   CESAREAN SECTION     NO PAST SURGERIES     TUBAL LIGATION       OB History     Gravida  3   Para  3   Term  3   Preterm      AB      Living  3      SAB      IAB      Ectopic      Multiple      Live Births  1           Family History  Problem Relation Age of Onset   Hypertension Father    Hearing loss Father     Social History   Tobacco Use   Smoking status: Every Day    Packs/day: 0.25    Pack years: 0.00    Types: Cigarettes   Smokeless tobacco: Never  Substance Use Topics   Alcohol use: No   Drug use: No    Home Medications Prior to Admission medications   Medication Sig Start  Date End Date Taking? Authorizing Provider  acetaminophen (TYLENOL) 325 MG tablet Take 650 mg by mouth every 6 (six) hours as needed for mild pain or headache.   Yes [provider]  ciprofloxacin (CIPRO) 500 MG tablet Take 1 tablet (500 mg total) by mouth 2 (two) times daily. One po bid x 7 days 02/28/21  Yes Bethann Berkshire, MD  citalopram (CELEXA) 10 MG tablet Take 10 mg by mouth daily. 02/24/21  Yes [provider]    Allergies    Ibuprofen and Penicillins  Review of Systems   Review of Systems  Constitutional:  Negative for appetite change and fatigue.  HENT:  Negative for congestion, ear discharge and sinus pressure.   Eyes:  Negative for discharge.  Respiratory:  Negative for cough.   Cardiovascular:  Positive for syncope. Negative for chest pain.  Gastrointestinal:  Positive for diarrhea. Negative for abdominal pain.  Genitourinary:  Negative for frequency and hematuria.  Musculoskeletal:  Negative for back  pain.  Skin:  Negative for rash.  Neurological:  Positive for syncope. Negative for seizures and headaches.  Psychiatric/Behavioral:  Negative for hallucinations.    Physical Exam Updated Vital Signs BP (!) 108/56   Pulse 99   Temp 98.8 F (37.1 C) (Oral)   Resp 16   Ht 5\' 2"  (1.575 m)   Wt 117.9 kg   LMP 02/27/2021   SpO2 94%   BMI 47.55 kg/m   Physical Exam Vitals and nursing note reviewed.  Constitutional:      Appearance: She is well-developed.  HENT:     Head: Normocephalic.     Mouth/Throat:     Mouth: Mucous membranes are moist.  Eyes:     General: No scleral icterus.    Conjunctiva/sclera: Conjunctivae normal.  Neck:     Thyroid: No thyromegaly.  Cardiovascular:     Rate and Rhythm: Normal rate and regular rhythm.     Heart sounds: No murmur heard.   No friction rub. No gallop.  Pulmonary:     Breath sounds: No stridor. No wheezing or rales.  Chest:     Chest wall: No tenderness.  Abdominal:     General: There is no  distension.     Tenderness: There is abdominal tenderness. There is no rebound.  Musculoskeletal:        General: Normal range of motion.     Cervical back: Neck supple.  Lymphadenopathy:     Cervical: No cervical adenopathy.  Skin:    Findings: No erythema or rash.  Neurological:     Mental Status: She is alert and oriented to person, place, and time.     Motor: No abnormal muscle tone.     Coordination: Coordination normal.  Psychiatric:        Behavior: Behavior normal.    ED Results / Procedures / Treatments   Labs (all labs ordered are listed, but only abnormal results are displayed) Labs Reviewed  CBC WITH DIFFERENTIAL/PLATELET - Abnormal; Notable for the following components:      Result Value   WBC 2.9 (*)    Hemoglobin 11.2 (*)    MCH 24.3 (*)    MCHC 29.6 (*)    All other components within normal limits  COMPREHENSIVE METABOLIC PANEL - Abnormal; Notable for the following components:   Sodium 134 (*)    Potassium 3.2 (*)    Glucose, Bld 119 (*)    Creatinine, Ser 1.44 (*)    Calcium 7.5 (*)    Albumin 3.1 (*)    AST 74 (*)    GFR, Estimated 49 (*)    All other components within normal limits  HCG, QUANTITATIVE, PREGNANCY  LACTIC ACID, PLASMA    EKG EKG Interpretation  Date/Time:  Wednesday February 28 2021 09:15:14 EDT Ventricular Rate:  87 PR Interval:  127 QRS Duration: 91 QT Interval:  439 QTC Calculation: 529 R Axis:   88 Text Interpretation: Sinus rhythm Borderline T abnormalities, anterior leads Prolonged QT interval Confirmed by 01-17-1982 [phone]) on 02/28/2021 1:33:01 PM  Radiology CT ABDOMEN PELVIS W CONTRAST  Result Date: 02/28/2021 CLINICAL DATA:  Nausea, diarrhea, fever and headache.  Syncope. EXAM: CT ABDOMEN AND PELVIS WITH CONTRAST TECHNIQUE: Multidetector CT imaging of the abdomen and pelvis was performed using the standard protocol following bolus administration of intravenous contrast. CONTRAST:  03/02/2021 OMNIPAQUE IOHEXOL 300 MG/ML   SOLN COMPARISON:  None. FINDINGS: Lower chest: Minimal patchy ground-glass in the right lower lobe possibly due to scarring. Heart  is at the upper limits of normal in size. No pericardial or pleural effusion. Distal esophagus is grossly unremarkable. Small hiatal hernia. Hepatobiliary: Liver and gallbladder are unremarkable. No biliary ductal dilatation. Pancreas: Negative. Spleen: Negative. Adrenals/Urinary Tract: Adrenal glands and kidneys are unremarkable. Ureters are decompressed. Bladder is grossly unremarkable. Stomach/Bowel: Small hiatal hernia. Stomach, small bowel, appendix and colon are otherwise unremarkable. Vascular/Lymphatic: Vascular structures are unremarkable. Scattered lymph nodes are not enlarged by CT size criteria. Reproductive: Uterus is visualized.  No adnexal mass. Other: No free fluid.  Mesenteries and peritoneum are unremarkable. Musculoskeletal: None. IMPRESSION: No findings to explain the patient's clinical history. Electronically Signed   By: Leanna Battles M.D.   On: 02/28/2021 13:02   DG Chest Port 1 View  Result Date: 02/26/2021 CLINICAL DATA:  35 year old female with cough and shortness of breath. EXAM: PORTABLE CHEST 1 VIEW COMPARISON:  None. FINDINGS: No focal consolidation, pleural effusion or pneumothorax. The cardiac silhouette is within limits. No acute osseous pathology. IMPRESSION: No active disease. Electronically Signed   By: Elgie Collard M.D.   On: 02/26/2021 17:57    Procedures Procedures   Medications Ordered in ED Medications  sodium chloride 0.9 % bolus 2,000 mL (0 mLs Intravenous Stopped 02/28/21 1248)  iohexol (OMNIPAQUE) 300 MG/ML solution 100 mL (100 mLs Intravenous Contrast Given 02/28/21 1237)  potassium chloride SA (KLOR-CON) CR tablet 40 mEq (40 mEq Oral Given 02/28/21 1335)    ED Course  I have reviewed the triage vital signs and the nursing notes.  Pertinent labs & imaging results that were available during my care of the patient were  reviewed by me and considered in my medical decision making (see chart for details).  CT scan unremarkable.  Patient with persistent diarrhea and syncopal episode from dehydration.  She is improved with fluids.  CBC shows leukopenia with some toxic granulations.  Lactic acid is normal.  Patient will be started on some Cipro on follow-up with GI MDM Rules/Calculators/A&P                          Syncope and persistent diarrhea and weakness Final Clinical Impression(s) / ED Diagnoses Final diagnoses:  Syncope and collapse    Rx / DC Orders ED Discharge Orders          Ordered    ciprofloxacin (CIPRO) 500 MG tablet  2 times daily        02/28/21 1349             Bethann Berkshire, MD 02/28/21 1357

## 2021-03-04 ENCOUNTER — Emergency Department (HOSPITAL_COMMUNITY): Payer: Medicaid Other

## 2021-03-04 ENCOUNTER — Other Ambulatory Visit: Payer: Self-pay

## 2021-03-04 ENCOUNTER — Inpatient Hospital Stay (HOSPITAL_COMMUNITY)
Admission: EM | Admit: 2021-03-04 | Discharge: 2021-03-09 | DRG: 640 | Disposition: A | Discharge status: Home / Self Care | Payer: Medicaid Other | Attending: Internal Medicine | Admitting: Internal Medicine

## 2021-03-04 ENCOUNTER — Inpatient Hospital Stay (HOSPITAL_COMMUNITY): Payer: Medicaid Other

## 2021-03-04 ENCOUNTER — Encounter (HOSPITAL_COMMUNITY): Payer: Self-pay | Admitting: *Deleted

## 2021-03-04 DIAGNOSIS — Z6841 Body Mass Index (BMI) 40.0 and over, adult: Secondary | ICD-10-CM | POA: Diagnosis not present

## 2021-03-04 DIAGNOSIS — Z79899 Other long term (current) drug therapy: Secondary | ICD-10-CM

## 2021-03-04 DIAGNOSIS — Z87891 Personal history of nicotine dependence: Secondary | ICD-10-CM | POA: Diagnosis not present

## 2021-03-04 DIAGNOSIS — B20 Human immunodeficiency virus [HIV] disease: Secondary | ICD-10-CM

## 2021-03-04 DIAGNOSIS — Z886 Allergy status to analgesic agent status: Secondary | ICD-10-CM | POA: Diagnosis not present

## 2021-03-04 DIAGNOSIS — Z21 Asymptomatic human immunodeficiency virus [HIV] infection status: Secondary | ICD-10-CM | POA: Diagnosis present

## 2021-03-04 DIAGNOSIS — R55 Syncope and collapse: Secondary | ICD-10-CM

## 2021-03-04 DIAGNOSIS — L02416 Cutaneous abscess of left lower limb: Secondary | ICD-10-CM | POA: Diagnosis present

## 2021-03-04 DIAGNOSIS — B370 Candidal stomatitis: Secondary | ICD-10-CM | POA: Diagnosis present

## 2021-03-04 DIAGNOSIS — E860 Dehydration: Secondary | ICD-10-CM | POA: Diagnosis present

## 2021-03-04 DIAGNOSIS — Z880 Allergy status to penicillin: Secondary | ICD-10-CM | POA: Diagnosis not present

## 2021-03-04 DIAGNOSIS — S60561A Insect bite (nonvenomous) of right hand, initial encounter: Secondary | ICD-10-CM | POA: Diagnosis present

## 2021-03-04 DIAGNOSIS — N179 Acute kidney failure, unspecified: Secondary | ICD-10-CM | POA: Diagnosis not present

## 2021-03-04 DIAGNOSIS — Z5329 Procedure and treatment not carried out because of patient's decision for other reasons: Secondary | ICD-10-CM | POA: Diagnosis not present

## 2021-03-04 DIAGNOSIS — R7989 Other specified abnormal findings of blood chemistry: Secondary | ICD-10-CM | POA: Diagnosis not present

## 2021-03-04 DIAGNOSIS — A6009 Herpesviral infection of other urogenital tract: Secondary | ICD-10-CM | POA: Diagnosis present

## 2021-03-04 DIAGNOSIS — R7401 Elevation of levels of liver transaminase levels: Secondary | ICD-10-CM

## 2021-03-04 DIAGNOSIS — I951 Orthostatic hypotension: Secondary | ICD-10-CM | POA: Diagnosis present

## 2021-03-04 DIAGNOSIS — D696 Thrombocytopenia, unspecified: Secondary | ICD-10-CM

## 2021-03-04 DIAGNOSIS — D61818 Other pancytopenia: Secondary | ICD-10-CM | POA: Diagnosis present

## 2021-03-04 DIAGNOSIS — L089 Local infection of the skin and subcutaneous tissue, unspecified: Secondary | ICD-10-CM | POA: Diagnosis present

## 2021-03-04 DIAGNOSIS — Z20822 Contact with and (suspected) exposure to covid-19: Secondary | ICD-10-CM | POA: Diagnosis present

## 2021-03-04 DIAGNOSIS — N170 Acute kidney failure with tubular necrosis: Secondary | ICD-10-CM | POA: Diagnosis present

## 2021-03-04 DIAGNOSIS — K760 Fatty (change of) liver, not elsewhere classified: Secondary | ICD-10-CM | POA: Diagnosis present

## 2021-03-04 DIAGNOSIS — W1839XA Other fall on same level, initial encounter: Secondary | ICD-10-CM | POA: Diagnosis present

## 2021-03-04 DIAGNOSIS — E871 Hypo-osmolality and hyponatremia: Secondary | ICD-10-CM | POA: Diagnosis present

## 2021-03-04 LAB — CBC WITH DIFFERENTIAL/PLATELET
Abs Immature Granulocytes: 0.02 10*3/uL (ref 0.00–0.07)
Basophils Absolute: 0 10*3/uL (ref 0.0–0.1)
Basophils Relative: 0 %
Eosinophils Absolute: 0 10*3/uL (ref 0.0–0.5)
Eosinophils Relative: 0 %
HCT: 37.9 % (ref 36.0–46.0)
Hemoglobin: 11.9 g/dL — ABNORMAL LOW (ref 12.0–15.0)
Immature Granulocytes: 1 %
Lymphocytes Relative: 28 %
Lymphs Abs: 0.8 10*3/uL (ref 0.7–4.0)
MCH: 24.3 pg — ABNORMAL LOW (ref 26.0–34.0)
MCHC: 31.4 g/dL (ref 30.0–36.0)
MCV: 77.5 fL — ABNORMAL LOW (ref 80.0–100.0)
Monocytes Absolute: 0.1 10*3/uL (ref 0.1–1.0)
Monocytes Relative: 4 %
Neutro Abs: 1.9 10*3/uL (ref 1.7–7.7)
Neutrophils Relative %: 67 %
Platelets: 98 10*3/uL — ABNORMAL LOW (ref 150–400)
RBC: 4.89 MIL/uL (ref 3.87–5.11)
RDW: 15.2 % (ref 11.5–15.5)
WBC: 2.9 10*3/uL — ABNORMAL LOW (ref 4.0–10.5)
nRBC: 0 % (ref 0.0–0.2)

## 2021-03-04 LAB — COMPREHENSIVE METABOLIC PANEL
ALT: 129 U/L — ABNORMAL HIGH (ref 0–44)
AST: 779 U/L — ABNORMAL HIGH (ref 15–41)
Albumin: 2.8 g/dL — ABNORMAL LOW (ref 3.5–5.0)
Alkaline Phosphatase: 58 U/L (ref 38–126)
Anion gap: 9 (ref 5–15)
BUN: 33 mg/dL — ABNORMAL HIGH (ref 6–20)
CO2: 19 mmol/L — ABNORMAL LOW (ref 22–32)
Calcium: 7.4 mg/dL — ABNORMAL LOW (ref 8.9–10.3)
Chloride: 102 mmol/L (ref 98–111)
Creatinine, Ser: 3.04 mg/dL — ABNORMAL HIGH (ref 0.44–1.00)
GFR, Estimated: 20 mL/min — ABNORMAL LOW (ref >60)
Glucose, Bld: 144 mg/dL — ABNORMAL HIGH (ref 70–99)
Potassium: 3.8 mmol/L (ref 3.5–5.1)
Sodium: 130 mmol/L — ABNORMAL LOW (ref 135–145)
Total Bilirubin: 0.8 mg/dL (ref 0.3–1.2)
Total Protein: 6.5 g/dL (ref 6.5–8.1)

## 2021-03-04 LAB — ETHANOL: Alcohol, Ethyl (B): <10 mg/dL (ref <10)

## 2021-03-04 LAB — SEDIMENTATION RATE: Sed Rate: 36 mm/hr — ABNORMAL HIGH (ref 0–22)

## 2021-03-04 LAB — ACETAMINOPHEN LEVEL: Acetaminophen (Tylenol), Serum: <10 ug/mL — ABNORMAL LOW (ref 10–30)

## 2021-03-04 LAB — AMMONIA: Ammonia: 25 umol/L (ref 9–35)

## 2021-03-04 LAB — LACTIC ACID, PLASMA
Lactic Acid, Venous: 1 mmol/L (ref 0.5–1.9)
Lactic Acid, Venous: 1.1 mmol/L (ref 0.5–1.9)

## 2021-03-04 LAB — RESP PANEL BY RT-PCR (FLU A&B, COVID) ARPGX2
Influenza A by PCR: NEGATIVE
Influenza B by PCR: NEGATIVE
SARS Coronavirus 2 by RT PCR: NEGATIVE

## 2021-03-04 LAB — C-REACTIVE PROTEIN: CRP: 0.5 mg/dL (ref <1.0)

## 2021-03-04 LAB — PROTIME-INR
INR: 1 (ref 0.8–1.2)
Prothrombin Time: 13.4 seconds (ref 11.4–15.2)

## 2021-03-04 MED ORDER — LACTATED RINGERS IV BOLUS
1000.0000 mL | Freq: Once | INTRAVENOUS | Status: AC
  Administered 2021-03-04: 1000 mL via INTRAVENOUS

## 2021-03-04 MED ORDER — ONDANSETRON HCL 4 MG PO TABS: 4.0000 mg | ORAL_TABLET | Freq: Four times a day (QID) | ORAL | Status: DC | PRN

## 2021-03-04 MED ORDER — POLYETHYLENE GLYCOL 3350 17 G PO PACK: 17.0000 g | PACK | Freq: Every day | ORAL | Status: DC | PRN

## 2021-03-04 MED ORDER — IOHEXOL 9 MG/ML PO SOLN
ORAL | Status: AC
  Filled 2021-03-04: qty 1000

## 2021-03-04 MED ORDER — ONDANSETRON HCL 4 MG/2ML IJ SOLN
4.0000 mg | Freq: Once | INTRAMUSCULAR | Status: AC
  Administered 2021-03-04: 4 mg via INTRAVENOUS
  Filled 2021-03-04: qty 2

## 2021-03-04 MED ORDER — ONDANSETRON HCL 4 MG/2ML IJ SOLN: 4.0000 mg | Freq: Four times a day (QID) | INTRAMUSCULAR | Status: DC | PRN

## 2021-03-04 MED ORDER — SODIUM CHLORIDE 0.9 % IV SOLN: INTRAVENOUS | Status: DC

## 2021-03-04 NOTE — ED Provider Notes (Addendum)
[facility] EMERGENCY DEPARTMENT Provider Note   CSN: [id number] Arrival date & time: 03/04/21  1601     History Chief Complaint  Patient presents with   Loss of Consciousness    Joan Peterson is a 35 y.o. female.  HPI Patient is a 35 year old female with a medical history as noted below.  She presents to the emergency department due to a syncopal episode that occurred this morning.  Patient states she has been feeling ill for about 3 weeks.  She reports cough, sore throat, weakness, fatigue, diarrhea, nausea/vomiting.  Nonbilious nonbloody.  She notes that her diarrhea has become black over the past 3 days.  Patient's been evaluated 2 times in the past 2 weeks for similar symptoms.  She states her symptoms have persisted.  At her last visit 4 days ago she was discharged with a prescription for ciprofloxacin as well as GI follow-up.  She states she has not contacted GI but has been taking her Cipro with little improvement.  This morning she was in the bathroom and she blacked out while standing at the sink.  She states that she was told that she fell backwards into the shower but does not remember this.  EMS was called and she was brought to the emergency department.  In the field she was found to be diaphoretic, mildly hypotensive, and started on IV fluids.  Patient denies any headache, head pain, neck pain, back pain, chest pain, shortness of breath, numbness, tingling.    Past Medical History:  Diagnosis Date   HSV-2 infection    Urinary tract infection     Patient Active Problem List   Diagnosis Date Noted   Gestational age, 10 weeks 04/01/2012   Single liveborn, born in hospital, delivered by cesarean delivery 04/01/2012   Pregnancy 03/23/2012    Past Surgical History:  Procedure Laterality Date   CESAREAN SECTION     NO PAST SURGERIES     TUBAL LIGATION       OB History     Gravida  3   Para  3   Term  3   Preterm      AB      Living  3      SAB       IAB      Ectopic      Multiple      Live Births  1           Family History  Problem Relation Age of Onset   Hypertension Father    Hearing loss Father     Social History   Tobacco Use   Smoking status: Former    Packs/day: 0.25    Pack years: 0.00    Types: Cigarettes    Quit date: 02/11/2021    Years since quitting: 0.0   Smokeless tobacco: Never  Vaping Use   Vaping Use: Never used  Substance Use Topics   Alcohol use: No   Drug use: No    Home Medications Prior to Admission medications   Medication Sig Start Date End Date Taking? Authorizing Provider  acetaminophen (TYLENOL) 325 MG tablet Take 650 mg by mouth every 6 (six) hours as needed for mild pain or headache.    [provider]  citalopram (CELEXA) 10 MG tablet Take 10 mg by mouth daily. 02/24/21   [provider]  ferrous sulfate 325 (65 FE) MG tablet Take 325 mg by mouth daily. 02/24/21   [provider]  omeprazole (PRILOSEC) 40 MG capsule Take 1 capsule by mouth daily. 02/24/21   [provider]    Allergies    Ibuprofen and Penicillins  Review of Systems   Review of Systems  All other systems reviewed and are negative. Ten systems reviewed and are negative for acute change, except as noted in the HPI.   Physical Exam Updated Vital Signs BP 97/72   Pulse 96   Temp 98.6 F (37 C) (Oral)   Resp (!) 22   Ht 5' 2"  (1.575 m)   Wt 117.9 kg   LMP 02/27/2021   SpO2 93%   BMI 47.55 kg/m   Physical Exam Vitals and nursing note reviewed.  Constitutional:      General: She is not in acute distress.    Appearance: Normal appearance. She is obese. She is not ill-appearing, toxic-appearing or diaphoretic.  HENT:     Head: Normocephalic and atraumatic.     Right Ear: External ear normal.     Left Ear: External ear normal.     Nose: Nose normal.     Mouth/Throat:     Mouth: Mucous membranes are moist.     Pharynx: Oropharynx is clear. No oropharyngeal exudate or  posterior oropharyngeal erythema.  Eyes:     General: No scleral icterus.       Right eye: No discharge.        Left eye: No discharge.     Extraocular Movements: Extraocular movements intact.     Conjunctiva/sclera: Conjunctivae normal.  Cardiovascular:     Rate and Rhythm: Normal rate and regular rhythm.     Pulses: Normal pulses.     Heart sounds: Normal heart sounds. No murmur heard.   No friction rub. No gallop.     Comments: RRR without M/R/G. Pulmonary:     Effort: Pulmonary effort is normal. No respiratory distress.     Breath sounds: Normal breath sounds. No stridor. No wheezing, rhonchi or rales.  Abdominal:     General: Abdomen is flat.     Palpations: Abdomen is soft.     Tenderness: There is no abdominal tenderness.     Comments: Abdomen is soft and nontender in all 4 quadrants.  Genitourinary:    Comments: Normal-appearing anal region.  No visible or palpable external or internal hemorrhoids noted.  Patient is currently menstruating and has dried blood from her vagina moving towards her anal region.  She has a small amount of brown stool noted in her rectal vault.  Guaiac positive but feel this is likely contaminant from menstrual bleeding. Musculoskeletal:        General: Normal range of motion.     Cervical back: Normal range of motion and neck supple. No tenderness.     Right lower leg: No edema.     Left lower leg: No edema.  Skin:    General: Skin is warm and dry.     Comments: Erythematous subcentimeter tender warm papular lesions noted to the buttock, hand, and left thigh.  Neurological:     General: No focal deficit present.     Mental Status: She is alert and oriented to person, place, and time.  Psychiatric:        Mood and Affect: Mood normal.        Behavior: Behavior normal.    ED Results / Procedures / Treatments   Labs (all labs ordered are listed, but only abnormal results are displayed) Labs Reviewed  COMPREHENSIVE METABOLIC PANEL - Abnormal;  Notable for the following components:      Result Value   Sodium 130 (*)    CO2 19 (*)    Glucose, Bld 144 (*)    BUN 33 (*)    Creatinine, Ser 3.04 (*)    Calcium 7.4 (*)    Albumin 2.8 (*)    AST 779 (*)    ALT 129 (*)    GFR, Estimated 20 (*)    All other components within normal limits  CBC WITH DIFFERENTIAL/PLATELET - Abnormal; Notable for the following components:   WBC 2.9 (*)    Hemoglobin 11.9 (*)    MCV 77.5 (*)    MCH 24.3 (*)    Platelets 98 (*)    All other components within normal limits  RESP PANEL BY RT-PCR (FLU A&B, COVID) [id number]  LACTIC ACID, PLASMA  URINALYSIS, ROUTINE W REFLEX MICROSCOPIC  PREGNANCY, URINE  LACTIC ACID, PLASMA  POC OCCULT BLOOD, ED   EKG EKG Interpretation  Date/Time:  Sunday March 04 2021 16:14:01 EDT Ventricular Rate:  95 PR Interval:  118 QRS Duration: 83 QT Interval:  345 QTC Calculation: 434 R Axis:   70 Text Interpretation: Sinus rhythm Borderline short PR interval Borderline T abnormalities, inferior leads Confirmed by Davonna Belling [id number]) on 03/04/2021 5:55:26 PM  Radiology DG Chest Portable 1 View  Result Date: 03/04/2021 CLINICAL DATA:  Productive cough EXAM: PORTABLE CHEST 1 VIEW COMPARISON:  Radiograph 02/26/2021 FINDINGS: No consolidation, features of edema, pneumothorax, or effusion. Pulmonary vascularity is normally distributed. The cardiomediastinal contours are unremarkable. No acute osseous or soft tissue abnormality. Telemetry leads overlie the chest. IMPRESSION: No acute cardiopulmonary abnormality. Electronically Signed   By: Lovena Le M.D.   On: 03/04/2021 17:15    Procedures .Critical Care  Date/Time: 03/04/2021 6:11 PM Performed by: Rayna Sexton, PA-C Authorized by: Rayna Sexton, PA-C   Critical care provider statement:    Critical care time (minutes):  30   Critical care was necessary to treat or prevent imminent or life-threatening deterioration of the following conditions:  Renal failure  and hepatic failure   Critical care was time spent personally by me on the following activities:  Discussions with consultants, evaluation of patient's response to treatment, examination of patient, ordering and performing treatments and interventions, ordering and review of laboratory studies, ordering and review of radiographic studies, pulse oximetry, re-evaluation of patient's condition, obtaining history from patient or surrogate and review of old charts   Medications Ordered in ED Medications  lactated ringers bolus 1,000 mL (1,000 mLs Intravenous New Bag/Given 03/04/21 1711)  ondansetron (ZOFRAN) injection 4 mg (4 mg Intravenous Given 03/04/21 1710)    ED Course  I have reviewed the triage vital signs and the nursing notes.  Pertinent labs & imaging results that were available during my care of the patient were reviewed by me and considered in my medical decision making (see chart for details).    MDM Rules/Calculators/A&P                          Pt is a 34 y.o. female who presents to the emergency department with nausea, vomiting, diarrhea as well as recurrent syncopal episodes.  Labs: CBC with neutropenia of 2.9, stable hemoglobin of 11.9, MCV of 77.5, platelets of 98.  Platelets reduced from 190, 4 days ago. CMP with a sodium of 130, CO2 of 19, glucose of 144, BUN of 33, creatinine of 3.04, calcium of 7.4, albumin of 2.8,  AST of 779, ALT of 129, GFR of 20.  Acute AKI.  Significantly elevated LFTs compared to CMP 4 days ago. Guaiac positive.  Patient currently menstruating and had traces of blood moving from the vaginal region to the anal region.  Likely contamination. Respiratory panel is pending. UA is pending.  Imaging: Chest x-ray shows no acute cardiopulmonary abnormalities.  I, Rayna Sexton, PA-C, personally reviewed and evaluated these images and lab results as part of my medical decision-making.  Patient found to be orthostatic at this visit.  Also extremely dizzy  when standing.  Likely the source of her syncopal episode.  Incidentally patient also found to have significantly elevated LFTs as well as an AKI at this visit.  BUN to creatinine ratio was about 10:1.  Possibly prerenal given patient's continued nausea, vomiting, diarrhea. Additional elevated AST as well as ALT.  Alk phos within normal limits as well as her total bilirubin.  Hepatorenal syndrome?  Nephrotoxicity/hepatotoxicity from ciprofloxacin?  Patient denies any recent known tick bites.  She does not drink alcohol.  Denies any significant APAP use.  No tenderness in her abdomen on my exam.  Patient will need admission for IV fluids as well as further work-up.  Will discuss with the medicine team.  Note: Portions of this report may have been transcribed using voice recognition software. Every effort was made to ensure accuracy; however, inadvertent computerized transcription errors may be present.   Final Clinical Impression(s) / ED Diagnoses Final diagnoses:  Syncope and collapse  AKI (acute kidney injury) ([place])  Elevated LFTs    Rx / DC Orders ED Discharge Orders     None        Rayna Sexton, PA-C 03/04/21 1820    Rayna Sexton, PA-C 03/04/21 1846    Davonna Belling, MD 03/05/21 [phone]

## 2021-03-04 NOTE — H&P (Signed)
History and Physical    Joan Peterson YPP:[id number] DOB: 08/11/1986 DOA: 03/04/2021  PCP: Allayne Butcher, NP   Patient coming from: Home  I have personally briefly reviewed patient's old medical records in [place]  Chief Complaint: Syncope  HPI: Joan Peterson is a 35 y.o. female with medical history significant for HSV 2 infection.  Patient presented to the ED with complaints of passing out today.  She was standing in the bathroom, she felt dizzy and then she fell into the bathtub.  Her mother was present.  Patient reports she hit her head. Patient reports over the past 3 weeks she has been feeling ill.  She reports vomiting, diarrhea, generalized weakness, sore throat, cough, fevers.  She reports about 3 episodes of vomiting and 3 episodes of diarrhea today.  No apparent blood in stool or vomitus, but she reports she has been seeing black stools over the past 4 days.  Since she was prescribed ciprofloxacin.  She denies any NSAID use.  She reports using Tylenol just once about a week ago.  She denies usage before or after. Patient also has 2 ?? bug bites on the finger of her right hand-4 days ago, and on the side of her left thigh-2 days ago.  She reports she just woke up and saw it.  She lives in [place], she reports she does not [place] in the woods, and cannot report call any tick bites. She denies drinking any alcohol.  No abdominal pain.  Patient is currently on her period.  No known contacts with similar illness.   This is patient's third visit in the ED for these symptoms.  She reports she has passed out twice.  Patient second visit to the ED she was started on a course of ciprofloxacin.  ED Course: Temperature 98.6.  Heart rate 60s to 90s.  Respiratory rate 15-22.  Blood pressure systolic down to 45/80, to 89/50 on standing and checking orthostatic vitals. Potassium elevated 3.04, sodium 130.  AST markedly elevated at 779, ALT 129.  Normal ALP and bilirubin.  Serum albumin  2.8.  WBC 2.9.  Platelets 98.  ED provider reports positive stool Occult, but patient is on her period and and there was lots of contamination. 1 L Ringer's lactate given.  Hospitalist to admit.  Review of Systems: As per HPI all other systems reviewed and negative.  Past Medical History:  Diagnosis Date   HSV-2 infection    Urinary tract infection     Past Surgical History:  Procedure Laterality Date   CESAREAN SECTION     NO PAST SURGERIES     TUBAL LIGATION       reports that she quit smoking about 3 weeks ago. Her smoking use included cigarettes. She smoked an average of 0.25 packs per day. She has never used smokeless tobacco. She reports that she does not drink alcohol and does not use drugs.  Allergies  Allergen Reactions   Ibuprofen Hives   Penicillins Hives    Family History  Problem Relation Age of Onset   Hypertension Father    Hearing loss Father     Prior to Admission medications   Medication Sig Start Date End Date Taking? Authorizing Provider  ciprofloxacin (CIPRO) 500 MG tablet Take 500 mg by mouth 2 (two) times daily.   Yes [provider]  omeprazole (PRILOSEC) 40 MG capsule Take 1 capsule by mouth daily. 02/24/21  Yes [provider]  acetaminophen (TYLENOL) 325 MG tablet Take  650 mg by mouth every 6 (six) hours as needed for mild pain or headache. Patient not taking: Reported on 03/04/2021    [provider]  citalopram (CELEXA) 10 MG tablet Take 10 mg by mouth daily. 02/24/21   [provider]  ferrous sulfate 325 (65 FE) MG tablet Take 325 mg by mouth daily. Patient not taking: Reported on 03/04/2021 02/24/21   [provider]    Physical Exam: Vitals:   03/04/21 1830 03/04/21 1900 03/04/21 1930 03/04/21 2000  BP: (!) 103/56 113/69 112/77 108/65  Pulse: 88   83  Resp: 15   15  Temp:      TempSrc:      SpO2: 100%   97%  Weight:      Height:        Constitutional: NAD, calm, comfortable Vitals:    03/04/21 1830 03/04/21 1900 03/04/21 1930 03/04/21 2000  BP: (!) 103/56 113/69 112/77 108/65  Pulse: 88   83  Resp: 15   15  Temp:      TempSrc:      SpO2: 100%   97%  Weight:      Height:       Eyes: PERRL, lids and conjunctivae normal ENMT: Mucous membranes are dry , 3 small whitish exudates on soft palate posteriorly, slight erythema to posterior pharynx, swab sample collected. Neck: normal, supple, no masses, no thyromegaly Respiratory: clear to auscultation bilaterally, no wheezing, no crackles. Normal respiratory effort. No accessory muscle use.  Cardiovascular: Regular rate and rhythm, no murmurs / rubs / gallops. No extremity edema. 2+ pedal pulses.  Abdomen: no tenderness, no masses palpated. No hepatosplenomegaly. Bowel sounds positive.  Musculoskeletal: no clubbing / cyanosis. No joint deformity upper and lower extremities. Good ROM, no contractures. Normal muscle tone.  Skin: 2 areas that appear to be bug bites on finger of right hand and left thigh area,  No induration Neurologic: No apparent cranial nerve abnormality, moving extremities spontaneously. Psychiatric: Normal judgment and insight. Alert and oriented x 3. Normal mood.   Labs on Admission: I have personally reviewed following labs and imaging studies  CBC: Recent Labs  Lab 02/26/21 1713 02/28/21 0957 03/04/21 1701  WBC 5.8 2.9* 2.9*  NEUTROABS 4.4 2.1 1.9  HGB 11.5* 11.2* 11.9*  HCT 38.0 37.9 37.9  MCV 81.9 82.4 77.5*  PLT 229 190 98*   Basic Metabolic Panel: Recent Labs  Lab 02/26/21 1713 02/28/21 0957 03/04/21 1701  NA 132* 134* 130*  K 3.6 3.2* 3.8  CL 99 104 102  CO2 24 22 19*  GLUCOSE 111* 119* 144*  BUN 10 12 33*  CREATININE 1.30* 1.44* 3.04*  CALCIUM 8.4* 7.5* 7.4*    Liver Function Tests: Recent Labs  Lab 02/26/21 1713 02/28/21 0957 03/04/21 1701  AST 40 74* 779*  ALT 33 43 129*  ALKPHOS 80 66 58  BILITOT 0.5 0.4 0.8  PROT 7.9 7.1 6.5  ALBUMIN 3.6 3.1* 2.8*     Radiological Exams on Admission: DG Chest Portable 1 View  Result Date: 03/04/2021 CLINICAL DATA:  Productive cough EXAM: PORTABLE CHEST 1 VIEW COMPARISON:  Radiograph 02/26/2021 FINDINGS: No consolidation, features of edema, pneumothorax, or effusion. Pulmonary vascularity is normally distributed. The cardiomediastinal contours are unremarkable. No acute osseous or soft tissue abnormality. Telemetry leads overlie the chest. IMPRESSION: No acute cardiopulmonary abnormality. Electronically Signed   By: Lovena Le M.D.   On: 03/04/2021 17:15    EKG: Independently reviewed.  By  Assessment/Plan Principal Problem:  AKI (acute kidney injury) ([place]) Active Problems:   Transaminitis   Syncope, vasovagal   Thrombocytopenia (HCC)   Acute kidney injury-creatinine 3.04, creatinine 6 days ago 1.3, baseline per Care Everywhere 2019 creatinine 0.6.  Likely from dehydration, hypotension-prerenal, ATN. -Follow-up UA -1 L bolus given, continue N/s 100cc/hr x 1 day  Transaminitis- AST 779, ALT 129.  With normal ALP and bilirubin.  Albumin low 2.8.  No significant alcohol or Tylenol use.  Differentials include hepatocellular injury from ischemia versus viral hepatitis-(considering what appears to be a viral prodrome) versus drug-induced liver injury.  Per up-to-date ciprofloxacin can cause hepatotoxicity.   - CT abd/Pelvis with contrast was obtained just 4 days ago, will defer further imaging for now, especially as liver enzyme elevation does not suggest obstructive pattern. -Check PT/INR - Check Tylenol level- WNL - Check Alcohol level- WNL -Acute hepatitis panel -GI consultation-I talked to gastroenterologist Dr. Gala Romney, recommended hydration, check PT/INR, liver enzymes again in the morning, etiology likely ischemia or shock liver.,  Hold off on checking autoimmune markers for now. -Rule out tick borne illnesses -Hold all nonessential medications, including ciprofloxacin - Check ESR,  CRP  Syncope and Fall-likely from hypotension and dehydration, blood pressure systolic down to 53/61 on standing. -Obtain head CT - Hydrate  Thrombocytopenia, leukopenia-platelets 98, WBC 2.9.  Likely viral etiology -Trend -Rule out tickborne disease  Hyponatremia-sodium 130.  Likely from GI losses, dehydration. -Hydrate.  Vomiting/diarrhea/sore throat-suggest viral etiology.  CT abdomen 4 days ago unremarkable. -Throat swab for gram stain, culture.   DVT prophylaxis: SCDs for now with thrombocytopenia. Code Status: Full code. Family Communication: None at bedside.  Spoke to patient's mother on the phone at patient's bedside. Disposition Plan: > 2 days Consults called: GI Admission status: Inpatient, telemetry I certify that at the point of admission it is my clinical judgment that the patient will require inpatient hospital care spanning beyond 2 midnights from the point of admission due to high intensity of service, high risk for further deterioration and high frequency of surveillance required.    Bethena Roys MD [facility]  03/04/2021, 9:52 PM

## 2021-03-04 NOTE — ED Triage Notes (Addendum)
Pt brought in by [facility] with c/o generalized weakness, diarrhea, vomiting x 3 days. Syncopal episode PTA. BP 90/58, HR 102 for EMS. Pt has been seen here recently for an URI, sore throat and dizziness. Pt given antibiotic and has been compliant with it. EMS reports pt was very diaphoretic on their arrival.

## 2021-03-04 NOTE — ED Notes (Addendum)
While undergoing orthostatic VS, from sitting to standing, patient complained of dizziness and weakness in knees. Patient told to sit down for the remainder of BP. BP 89/50 and patient placed in bed and trendelenburg position.  [place], [place] made aware and see current orders.

## 2021-03-05 ENCOUNTER — Inpatient Hospital Stay (HOSPITAL_COMMUNITY): Payer: Medicaid Other

## 2021-03-05 ENCOUNTER — Encounter (HOSPITAL_COMMUNITY): Payer: Self-pay | Admitting: Internal Medicine

## 2021-03-05 LAB — COMPREHENSIVE METABOLIC PANEL
ALT: 116 U/L — ABNORMAL HIGH (ref 0–44)
AST: 678 U/L — ABNORMAL HIGH (ref 15–41)
Albumin: 2.5 g/dL — ABNORMAL LOW (ref 3.5–5.0)
Alkaline Phosphatase: 53 U/L (ref 38–126)
Anion gap: 9 (ref 5–15)
BUN: 40 mg/dL — ABNORMAL HIGH (ref 6–20)
CO2: 21 mmol/L — ABNORMAL LOW (ref 22–32)
Calcium: 7.3 mg/dL — ABNORMAL LOW (ref 8.9–10.3)
Chloride: 103 mmol/L (ref 98–111)
Creatinine, Ser: 3.53 mg/dL — ABNORMAL HIGH (ref 0.44–1.00)
GFR, Estimated: 17 mL/min — ABNORMAL LOW (ref >60)
Glucose, Bld: 119 mg/dL — ABNORMAL HIGH (ref 70–99)
Potassium: 3.7 mmol/L (ref 3.5–5.1)
Sodium: 133 mmol/L — ABNORMAL LOW (ref 135–145)
Total Bilirubin: 0.9 mg/dL (ref 0.3–1.2)
Total Protein: 5.9 g/dL — ABNORMAL LOW (ref 6.5–8.1)

## 2021-03-05 LAB — PROTIME-INR
INR: 1 (ref 0.8–1.2)
Prothrombin Time: 13.2 seconds (ref 11.4–15.2)

## 2021-03-05 LAB — CBC
HCT: 34.7 % — ABNORMAL LOW (ref 36.0–46.0)
Hemoglobin: 10.6 g/dL — ABNORMAL LOW (ref 12.0–15.0)
MCH: 24.1 pg — ABNORMAL LOW (ref 26.0–34.0)
MCHC: 30.5 g/dL (ref 30.0–36.0)
MCV: 78.9 fL — ABNORMAL LOW (ref 80.0–100.0)
Platelets: 90 10*3/uL — ABNORMAL LOW (ref 150–400)
RBC: 4.4 MIL/uL (ref 3.87–5.11)
RDW: 15.4 % (ref 11.5–15.5)
WBC: 2.1 10*3/uL — ABNORMAL LOW (ref 4.0–10.5)
nRBC: 0 % (ref 0.0–0.2)

## 2021-03-05 LAB — IRON AND TIBC
Iron: 25 ug/dL — ABNORMAL LOW (ref 28–170)
Saturation Ratios: 8 % — ABNORMAL LOW (ref 10.4–31.8)
TIBC: 314 ug/dL (ref 250–450)
UIBC: 289 ug/dL

## 2021-03-05 LAB — HEPATITIS PANEL, ACUTE
HCV Ab: NONREACTIVE
Hep A IgM: NONREACTIVE
Hep B C IgM: NONREACTIVE
Hepatitis B Surface Ag: NONREACTIVE

## 2021-03-05 LAB — RETICULOCYTES
Immature Retic Fract: 4.9 % (ref 2.3–15.9)
RBC.: 4.36 MIL/uL (ref 3.87–5.11)
Retic Count, Absolute: 14.4 10*3/uL — ABNORMAL LOW (ref 19.0–186.0)
Retic Ct Pct: <0.4 % — ABNORMAL LOW (ref 0.4–3.1)

## 2021-03-05 LAB — FOLATE: Folate: 15.4 ng/mL (ref >5.9)

## 2021-03-05 LAB — OCCULT BLOOD, POC DEVICE: Fecal Occult Bld: POSITIVE — AB

## 2021-03-05 LAB — VITAMIN B12: Vitamin B-12: 365 pg/mL (ref 180–914)

## 2021-03-05 LAB — HIV ANTIBODY (ROUTINE TESTING W REFLEX): HIV Screen 4th Generation wRfx: REACTIVE — AB

## 2021-03-05 LAB — FERRITIN: Ferritin: 2051 ng/mL — ABNORMAL HIGH (ref 11–307)

## 2021-03-05 MED ORDER — SODIUM CHLORIDE 0.9 % IV SOLN: INTRAVENOUS | Status: DC

## 2021-03-05 MED ORDER — PROCHLORPERAZINE EDISYLATE 10 MG/2ML IJ SOLN
10.0000 mg | Freq: Four times a day (QID) | INTRAMUSCULAR | Status: DC | PRN
  Administered 2021-03-06: 10 mg via INTRAVENOUS
  Filled 2021-03-05: qty 2

## 2021-03-05 NOTE — Progress Notes (Signed)
PROGRESS NOTE    Joan Peterson  SUO:[id number] DOB: February 17, 1986 DOA: 03/04/2021 PCP: Allayne Butcher, NP  Outpatient Specialists:   Brief Narrative: Joan Peterson is a 35 yo female with medical history significant for HSV2 infection who was admitted 6/26 for syncope, hypostatic hypotension, elevated AST (in the 700s) and AKI.   Assessment & Plan:   Principal Problem:   AKI (acute kidney injury) ([place]) Active Problems:   Transaminitis   Syncope, vasovagal   Thrombocytopenia (HCC)  #AKI - Prerenal / Creatine 3.53, up from admission (3.04). Baseline per care everywhere 2019 cr 0.6. Likely from dehydration, hypotension-prerenal, ATN - UA ordered, pending - ESR 36, CRP 0.5 - Continue N/S 100cc/hr x 1day  #Elevated LFT Ddx include hepatocellular injury from ischemia vs viral hepatitis vs drug-induced liver injury (given onset after starting ciprofloxacin, which has been found to cause hepatotoxicity, though unlikely because such a short course). Today, AST and ALT trending down though still elevated. Protein and albumin remain low; INR 1.0 not concerning for synthetic dysfunction.  -AST 678 (down from 779), ALT 116 (down from 129). Normal ALP, though trending down (80 ->66 ->  55 -> 53). Normal bili. Albumin low 2.5 (from 2.8).  - ESR 36, CRP normal likely 2/2 AKI - RMSF, Erlichia, and B burgdorfi all pending. Consider doxy ppx when AKI resolves - Acute hepatitis panel pending  - GI consulted; recs RUQ [place], continue to monitor LFTs - F/u RUQ [place]  #Orthostatic hypotension - likely from dehydration (v/n/d) - BP 102/66 improved from 89/50  - Continue NS 100cc/hr x 1 day  #Pancytopenia - platelets 90 (down from 98), WBC 2.1 (down from 2.9), Hgb 10.6. Likely viral etiology vs tickborne disease - CT abdomen unremarkable 5 days ago, no evidence of malignancy - RMSF, Erlichia, and B burgdorfi all pending.  #Hyponatremia - sodium 131. Likely from GI losses, dehydration - Hydrate  #Nausea -  likely viral gastroenteritis  - F/u gram stain, culture - Zofran 72m q6h PRN, Compazine 128mq6h PRN  - Hydrate   DVT prophylaxis: SCD for now with thrombocytopenia Code Status: Full code Family Communication: plan of care discussed with patient at bedside who verbalized understanding Disposition Plan:  - GI consult pending - Treating AKI  - Home health with rolling walker per PT recs   Consultants:  Rourk (GI)  Procedures: (Don't include imaging studies which can be auto populated. Include things that cannot be auto populated i.e. Echo, Carotid and venous dopplers, Foley, Bipap, HD, tubes/drains, wound vac, central lines etc) None  Antimicrobials: Hold ciprofloxacin    Subjective: Patient states that she still feels nauseous which kept her from eating/drinking. She was OOB this AM with PT and states that it was very tiring. Denies new episodes of vomiting. Denies diarrhea, fever, shortness of breath, headache.   Objective: Vitals:   03/04/21 2230 03/04/21 2327 03/05/21 0503 03/05/21 0942  BP: 111/61 109/66 102/66   Pulse: 81 87 88   Resp: (!) _0 Temp:  98.5 F (36.9 C) (!) 97.5 F (36.4 C)   TempSrc:  Oral    SpO2: 97% 98% 99% 99%  Weight:      Height:        Intake/Output Summary (Last 24 hours) at 03/05/2021 0952 Last data filed at 03/05/2021 0927 Gross per 24 hour  Intake 240 ml  Output --  Net 240 ml   Filed Weights   03/04/21 1610  Weight: 117.9 kg    Examination:  Eyes: PERRL, lids and conjunctivae normal ENMT: Mucous membranes are dry with 3 small exudates unchanged from yesterday Neck: normal, supple, no masses, no thyromegaly Respiratory: clear to auscultation bilaterally, no wheezing, no crackles. Normal respiratory effort. No accessory muscle use. Cardiovascular: Regular rate and rhythm, no murmurs / rubs / gallops. No extremity edema. 2+ pedal pulses. Abdomen: Tenderness upon palpation of RUQ. no masses palpated. No hepatosplenomegaly.  Bowel sounds positive. Musculoskeletal: no clubbing / cyanosis. No joint deformity upper and lower extremities. Good ROM, no contractures. Normal muscle tone. Skin: Bug bites on finger of right hand and left thigh area,  No induration or fluctuance of surrounded skin  Neurologic: No apparent cranial nerve abnormality, moving extremities spontaneously. Psychiatric: Normal judgment and insight. Alert and oriented x 3. Normal mood.  Data Reviewed: I have personally reviewed following labs and imaging studies  CBC: Recent Labs  Lab 02/26/21 1713 02/28/21 0957 03/04/21 1701 03/05/21 0518  WBC 5.8 2.9* 2.9* 2.1*  NEUTROABS 4.4 2.1 1.9  --   HGB 11.5* 11.2* 11.9* 10.6*  HCT 38.0 37.9 37.9 34.7*  MCV 81.9 82.4 77.5* 78.9*  PLT 229 190 98* 90*   Basic Metabolic Panel: Recent Labs  Lab 02/26/21 1713 02/28/21 0957 03/04/21 1701 03/05/21 0518  NA 132* 134* 130* 133*  K 3.6 3.2* 3.8 3.7  CL 99 104 102 103  CO2 24 22 19* 21*  GLUCOSE 111* 119* 144* 119*  BUN 10 12 33* 40*  CREATININE 1.30* 1.44* 3.04* 3.53*  CALCIUM 8.4* 7.5* 7.4* 7.3*   GFR: Estimated Creatinine Clearance: 27.4 mL/min (A) (by C-G formula based on SCr of 3.53 mg/dL (H)). Liver Function Tests: Recent Labs  Lab 02/26/21 1713 02/28/21 0957 03/04/21 1701 03/05/21 0518  AST 40 74* 779* 678*  ALT 33 43 129* 116*  ALKPHOS 80 66 58 53  BILITOT 0.5 0.4 0.8 0.9  PROT 7.9 7.1 6.5 5.9*  ALBUMIN 3.6 3.1* 2.8* 2.5*   No results for input(s): LIPASE, AMYLASE in the last 168 hours. Recent Labs  Lab 03/04/21 2019  AMMONIA 25   Coagulation Profile: Recent Labs  Lab 03/04/21 2018 03/05/21 0518  INR 1.0 1.0   Cardiac Enzymes: No results for input(s): CKTOTAL, CKMB, CKMBINDEX, TROPONINI in the last 168 hours. BNP (last 3 results) No results for input(s): PROBNP in the last 8760 hours. HbA1C: No results for input(s): HGBA1C in the last 72 hours. CBG: No results for input(s): GLUCAP in the last 168 hours. Lipid  Profile: No results for input(s): CHOL, HDL, LDLCALC, TRIG, CHOLHDL, LDLDIRECT in the last 72 hours. Thyroid Function Tests: No results for input(s): TSH, T4TOTAL, FREET4, T3FREE, THYROIDAB in the last 72 hours. Anemia Panel: No results for input(s): VITAMINB12, FOLATE, FERRITIN, TIBC, IRON, RETICCTPCT in the last 72 hours. Urine analysis:  Sepsis Labs: _0 (procalcitonin:4,lacticidven:4)  ) Recent Results (from the past 240 hour(s))  Resp Panel by RT-PCR (Flu A&B, Covid) Nasopharyngeal Swab     Status: None   Collection Time: 02/26/21  5:13 PM   Specimen: Nasopharyngeal Swab; Nasopharyngeal(NP) swabs in vial transport medium  Result Value Ref Range Status   SARS Coronavirus 2 by RT PCR NEGATIVE NEGATIVE Final    Comment: (NOTE) SARS-CoV-2 target nucleic acids are NOT DETECTED.  The SARS-CoV-2 RNA is generally detectable in upper respiratory specimens during the acute phase of infection. The lowest concentration of SARS-CoV-2 viral copies this assay can detect is 138 copies/mL. A negative result does not preclude SARS-Cov-2 infection and should not be used as  the sole basis for treatment or other patient management decisions. A negative result may occur with  improper specimen collection/handling, submission of specimen other than nasopharyngeal swab, presence of viral mutation(s) within the areas targeted by this assay, and inadequate number of viral copies(<138 copies/mL). A negative result must be combined with clinical observations, patient history, and epidemiological information. The expected result is Negative.  Fact Sheet for Patients:  EntrepreneurPulse.com.au  Fact Sheet for Healthcare Providers:  IncredibleEmployment.be  This test is no t yet approved or cleared by the [place] FDA and  has been authorized for detection and/or diagnosis of SARS-CoV-2 by FDA under an Emergency Use Authorization (EUA). This EUA will  remain  in effect (meaning this test can be used) for the duration of the COVID-19 declaration under Section 564(b)(1) of the Act, 21 U.S.C.section 360bbb-3(b)(1), unless the authorization is terminated  or revoked sooner.       Influenza A by PCR NEGATIVE NEGATIVE Final   Influenza B by PCR NEGATIVE NEGATIVE Final    Comment: (NOTE) The Xpert Xpress SARS-CoV-2/FLU/RSV plus assay is intended as an aid in the diagnosis of influenza from Nasopharyngeal swab specimens and should not be used as a sole basis for treatment. Nasal washings and aspirates are unacceptable for Xpert Xpress SARS-CoV-2/FLU/RSV testing.  Fact Sheet for Patients: EntrepreneurPulse.com.au  Fact Sheet for Healthcare Providers: IncredibleEmployment.be  This test is not yet approved or cleared by the [place] FDA and has been authorized for detection and/or diagnosis of SARS-CoV-2 by FDA under an Emergency Use Authorization (EUA). This EUA will remain in effect (meaning this test can be used) for the duration of the COVID-19 declaration under Section 564(b)(1) of the Act, 21 U.S.C. section 360bbb-3(b)(1), unless the authorization is terminated or revoked.  Performed at [facility], [address] [place], [place] [address]   Resp Panel by RT-PCR (Flu A&B, Covid) Nasopharyngeal Swab     Status: None   Collection Time: 03/04/21  6:11 PM   Specimen: Nasopharyngeal Swab; Nasopharyngeal(NP) swabs in vial transport medium  Result Value Ref Range Status   SARS Coronavirus 2 by RT PCR NEGATIVE NEGATIVE Final    Comment: (NOTE) SARS-CoV-2 target nucleic acids are NOT DETECTED.  The SARS-CoV-2 RNA is generally detectable in upper respiratory specimens during the acute phase of infection. The lowest concentration of SARS-CoV-2 viral copies this assay can detect is 138 copies/mL. A negative result does not preclude SARS-Cov-2 infection and should not be used as the sole basis  for treatment or other patient management decisions. A negative result may occur with  improper specimen collection/handling, submission of specimen other than nasopharyngeal swab, presence of viral mutation(s) within the areas targeted by this assay, and inadequate number of viral copies(<138 copies/mL). A negative result must be combined with clinical observations, patient history, and epidemiological information. The expected result is Negative.  Fact Sheet for Patients:  EntrepreneurPulse.com.au  Fact Sheet for Healthcare Providers:  IncredibleEmployment.be  This test is no t yet approved or cleared by the [place] FDA and  has been authorized for detection and/or diagnosis of SARS-CoV-2 by FDA under an Emergency Use Authorization (EUA). This EUA will remain  in effect (meaning this test can be used) for the duration of the COVID-19 declaration under Section 564(b)(1) of the Act, 21 U.S.C.section 360bbb-3(b)(1), unless the authorization is terminated  or revoked sooner.       Influenza A by PCR NEGATIVE NEGATIVE Final   Influenza B by PCR NEGATIVE NEGATIVE Final  Comment: (NOTE) The Xpert Xpress SARS-CoV-2/FLU/RSV plus assay is intended as an aid in the diagnosis of influenza from Nasopharyngeal swab specimens and should not be used as a sole basis for treatment. Nasal washings and aspirates are unacceptable for Xpert Xpress SARS-CoV-2/FLU/RSV testing.  Fact Sheet for Patients: EntrepreneurPulse.com.au  Fact Sheet for Healthcare Providers: IncredibleEmployment.be  This test is not yet approved or cleared by the [place] FDA and has been authorized for detection and/or diagnosis of SARS-CoV-2 by FDA under an Emergency Use Authorization (EUA). This EUA will remain in effect (meaning this test can be used) for the duration of the COVID-19 declaration under Section 564(b)(1) of the Act, 21  U.S.C. section 360bbb-3(b)(1), unless the authorization is terminated or revoked.  Performed at [facility], [address] [place], [place] [address]      Radiology Studies: DG Chest Portable 1 View  Result Date: 03/04/2021 CLINICAL DATA:  Productive cough EXAM: PORTABLE CHEST 1 VIEW COMPARISON:  Radiograph 02/26/2021 FINDINGS: No consolidation, features of edema, pneumothorax, or effusion. Pulmonary vascularity is normally distributed. The cardiomediastinal contours are unremarkable. No acute osseous or soft tissue abnormality. Telemetry leads overlie the chest. IMPRESSION: No acute cardiopulmonary abnormality. Electronically Signed   By: Lovena Le M.D.   On: 03/04/2021 17:15     Scheduled Meds: Continuous Infusions:  sodium chloride       LOS: 1 day    Time spent: 30 minutes  Fredrik Rigger, MS4 [facility] Medicine Team  If 7PM-7AM, please contact night-coverage [email or web address] Password [email or web address] 03/05/2021, 9:52 AM  _____________________________________________________________________   Patient seen and examined with Fredrik Rigger, MS4, Medical student. In addition to supervising the encounter, I played a key role in the decision making process as well as reviewed key findings.  I edited notes and made additions as needed.  Appreciate GI consultation and recommendations.  She is clinically slow to improve and remains very weak.  Outstanding send out labs are pending.  Query if we should empirically start doxycycline in setting of possible tick exposure.  Await GI attending evaluation.   Follow labs Follow cultures Continue IV fluid hydration PT eval recommending HH PT    See orders.   Murvin Natal, MD  Attending physician [facility] How to contact the [facility] Attending or Consulting provider [place] or covering provider during after hours [place], for this patient?  Check the care team in [facility] and look for a) attending/consulting [facility] provider listed and b) the [facility] team  listed Log into [email or web address] and use [place]'s universal password to access. If you do not have the password, please contact the hospital operator. Locate the [facility] provider you are looking for under [facility] and page to a number that you can be directly reached. If you still have difficulty reaching the provider, please page the [facility] (Director on Call) for the Hospitalists listed on amion for assistance.

## 2021-03-05 NOTE — Consult Note (Addendum)
Referring Provider: Dr. Denton Brick Primary Care Physician:  Allayne Butcher, NP Primary Gastroenterologist:  Dr.Carver  Date of Admission: 03/04/21 Date of Consultation: 03/05/21  Reason for Consultation:  Transaminitis  HPI:  Joan Peterson is a 35 y.o. year-old [place] American female with history of significant HSV 2 infection, Presented last night to the ED with c/o syncopal episode while in her bathroom, patient reports hitting her head at time of fall. Patient seen a total of 3 times in ED over the past week with similar symptoms of fever, cough, n/v/d, abdominal pain, dizziness, generalized weakness and syncope. Febrile at 101 initially. URI suspected on initial visit and patient discharged with recommendation of supportive treatment. She subsequently returned for a second ED visit on 6/22 with similar symptoms of headache, nausea, diarrhea and syncopal episode, labs at that time showed slight increase of creatnine from 1.3 to 1.44 and AST from 40 to 74, however,it was suspected these increases were related to dehydration.  Upon third visit to ED yesterday for syncopal episode in the setting of acute hypotension, patient found to have significant increase in transaminases (AST 779 up from 74 and ALT 129 up from 43 on 6/22) and creatnine/BUN  (3.04 and 33, up from 1.44 and 12, respectively). Transaminases starting to trend down, with AST and ALT now 678 and 116 respectively. Alk phos, INR and total bilirubin have remained normal throughout course. Platelet count now 90, down slightly from 98 yesterday, but significantly lower than initial count of 190 on 6/20.   Acetaminophen level and Ethanol unremarkable. GI consulted for transaminitis.   Mild anemia in the 11s noted on previous two visits with/Heme POSITIVE stool yesterday, however, patient denies any hemathochezia or melena, and is currently on her menstrual cycle. Sample was thought to have possibly been contaminated, resulting in false  positive. Hbg declined slightly to 10.6 this morning.    Acute Hepatitis panel and tick borne illness labs still pending.  Patient reports multiple episodes of syncope over the past few weeks. She endorses, fever, chills, cough, nausea, vomiting and diarrhea throughout that time. She denies any recent sick contacts. No melena or hematochezia. Denies easy bruising or bleeding. Does endorse 2 "bug bites" on to R hand and the other to L upper thigh that she noticed 3 days ago. No other skin anomalies present.   Denies history of liver problems or significant family history of liver issues. No ETOH, significant Tylenol or illicit drug use.   Past Medical History:  Diagnosis Date   HSV-2 infection    Urinary tract infection     Past Surgical History:  Procedure Laterality Date   CESAREAN SECTION     NO PAST SURGERIES     TUBAL LIGATION      Prior to Admission medications   Medication Sig Start Date End Date Taking? Authorizing Provider  ciprofloxacin (CIPRO) 500 MG tablet Take 500 mg by mouth 2 (two) times daily.   Yes [provider]  omeprazole (PRILOSEC) 40 MG capsule Take 1 capsule by mouth daily. 02/24/21  Yes [provider]  acetaminophen (TYLENOL) 325 MG tablet Take 650 mg by mouth every 6 (six) hours as needed for mild pain or headache. Patient not taking: Reported on 03/04/2021    [provider]  citalopram (CELEXA) 10 MG tablet Take 10 mg by mouth daily. 02/24/21   [provider]  ferrous sulfate 325 (65 FE) MG tablet Take 325 mg by mouth daily. Patient not taking: Reported on 03/04/2021 02/24/21  [provider]    Current Facility-Administered Medications  Medication Dose Route Frequency Provider Last Rate Last Admin   0.9 %  sodium chloride infusion   Intravenous Continuous Emokpae, Ejiroghene E, MD       ondansetron (ZOFRAN) tablet 4 mg  4 mg Oral Q6H PRN Emokpae, Ejiroghene E, MD       Or   ondansetron (ZOFRAN) injection 4 mg   4 mg Intravenous Q6H PRN Emokpae, Ejiroghene E, MD       polyethylene glycol (MIRALAX / GLYCOLAX) packet 17 g  17 g Oral Daily PRN Emokpae, Ejiroghene E, MD       prochlorperazine (COMPAZINE) injection 10 mg  10 mg Intravenous Q6H PRN Wynetta Emery, Clanford L, MD        Allergies as of 03/04/2021 - Review Complete 03/04/2021  Allergen Reaction Noted   Ibuprofen Hives 08/10/2011   Penicillins Hives 04/01/2012    Family History  Problem Relation Age of Onset   Hypertension Father    Hearing loss Father     Social History   Socioeconomic History   Marital status: Single    Spouse name: Not on file   Number of children: Not on file   Years of education: Not on file   Highest education level: Not on file  Occupational History   Not on file  Tobacco Use   Smoking status: Former    Packs/day: 0.25    Pack years: 0.00    Types: Cigarettes    Quit date: 02/11/2021    Years since quitting: 0.0   Smokeless tobacco: Never  Vaping Use   Vaping Use: Never used  Substance and Sexual Activity   Alcohol use: No   Drug use: No   Sexual activity: Yes  Other Topics Concern   Not on file  Social History Narrative   Not on file   Social Determinants of Health   Financial Resource Strain: Not on file  Food Insecurity: Not on file  Transportation Needs: Not on file  Physical Activity: Not on file  Stress: Not on file  Social Connections: Not on file  Intimate Partner Violence: Not on file    Review of Systems: Gen: Denies loss of appetite, change in weight or weight loss. Endorses for fever and chills prior to admission. CV: Denies chest pain, heart palpitations, syncope, edema  Resp: Denies shortness of breath with rest, wheezing. Endorses cough. GI: Denies dysphagia or odynophagia. Denies vomiting blood, jaundice, and fecal incontinence.  GU : Denies urinary burning, urinary frequency, urinary incontinence.  MS: Denies joint pain,swelling, cramping Derm: Denies rash, itching, dry  skin. Two suspected insect bites, one to right hand and one to L lateral thigh. Psych: Denies depression, anxiety,confusion, or memory loss Heme: Denies bruising, bleeding, and enlarged lymph nodes.  Physical Exam: Vital signs in last 24 hours: Temp:  [97.5 F (36.4 C)-98.6 F (37 C)] 97.5 F (36.4 C) (06/27 0503) Pulse Rate:  [81-96] 88 (06/27 0503) Resp:  [15-22] 18 (06/27 0503) BP: (96-113)/(53-77) 102/66 (06/27 0503) SpO2:  [93 %-100 %] 99 % (06/27 0942) Weight:  [117.9 kg] 117.9 kg (06/26 1610)   General:   Alert,  Well-developed, well-nourished, pleasant and cooperative in NAD Head:  Normocephalic and atraumatic. Neck:  Supple; no masses or thyromegaly. Lungs:  Clear throughout to auscultation.   No wheezes, crackles, or rhonchi. No acute distress. Heart:  Regular rate and rhythm; no murmurs, clicks, rubs,  or gallops. Abdomen:  Soft, and nondistended. No masses, hepatosplenomegaly  or hernias noted. Normal bowel sounds, without guarding, and without rebound.  TTP R mid to lower abdomen. Rectal:  Deferred until time of colonoscopy.   Msk:  Symmetrical without gross deformities. Normal posture. Pulses:  Normal pulses noted. Extremities:  Without edema. Neurologic:  Alert and  oriented x4;  grossly normal neurologically. Skin:  Intact without rashes or bruising. Two insect bites, one to R hand and one to L lateral thigh. Psych:  Alert and cooperative. Normal mood and affect.  Intake/Output from previous day: No intake/output data recorded. Intake/Output this shift: Total I/O In: 240 [P.O.:240] Out: -   Lab Results: Recent Labs    03/04/21 1701 03/05/21 0518  WBC 2.9* 2.1*  HGB 11.9* 10.6*  HCT 37.9 34.7*  PLT 98* 90*   BMET Recent Labs    03/04/21 1701 03/05/21 0518  NA 130* 133*  K 3.8 3.7  CL 102 103  CO2 19* 21*  GLUCOSE 144* 119*  BUN 33* 40*  CREATININE 3.04* 3.53*  CALCIUM 7.4* 7.3*   LFT Recent Labs    03/04/21 1701 03/05/21 0518  PROT 6.5  5.9*  ALBUMIN 2.8* 2.5*  AST 779* 678*  ALT 129* 116*  ALKPHOS 58 53  BILITOT 0.8 0.9   PT/INR Recent Labs    03/04/21 2018 03/05/21 0518  LABPROT 13.4 13.2  INR 1.0 1.0   Studies/Results: DG Chest Portable 1 View  Result Date: 03/04/2021 CLINICAL DATA:  Productive cough EXAM: PORTABLE CHEST 1 VIEW COMPARISON:  Radiograph 02/26/2021 FINDINGS: No consolidation, features of edema, pneumothorax, or effusion. Pulmonary vascularity is normally distributed. The cardiomediastinal contours are unremarkable. No acute osseous or soft tissue abnormality. Telemetry leads overlie the chest. IMPRESSION: No acute cardiopulmonary abnormality. Electronically Signed   By: Lovena Le M.D.   On: 03/04/2021 17:15    Impression: Sayla Golonka is a 35 year-old [place] American female with history of significant HSV 2 infection, Presented last night to the ED with c/o syncopal episode while in her bathroom with a total of three ED visits over the past week for similar symptoms of cough, n/v/d, abdominal pain, dizziness, generalized weakness and syncope. Transaminases and Renal function bumped significantly on third ED visit last night, (AST 779 up from 74 and ALT 129 up from 43 on 6/22) and creatnine/BUN  (3.04 and 33, up from 1.44 and 12, respectively). Patient with mild anemia (hgb in the 11s) throughout course of hospital visits over the past week, currently on menstrual cycle, no other obvious bleeding. Platelets low at 90, trending down since 6/20 with initial count of 229.  Significant elevation in transaminases prompted consult of our service. Patient denies ETOH, illicit drugs or Tylenol use. Acute hepatitis panel, RMSF, Erlichia and B. Burgdorfi all pending. Should consider autoimmune hepatitis if above mentioned labs are unrevealing.   Query hepatic ischemia in setting of hypotension and rapid increase in transaminases yesterday, that are now trending down. Alk Phos, total bili and INR have remained  WNL throughout course. Will proceed with RUQ [place] and continue to monitor LFTs. Suspect decrease in platelets is r/t acute illness process.   Low suspicion for cipro induced hepatotoxicity due to such a short course of treatment being completed prior to elevation in transaminases.  Hemoglobin remains stable at 10.6, patient did have heme positive stool in ED, however, questionably false positive due to concurrent menstrual cycle. No hemathochezia, melena or hematemesis. Should continue to trend hgb, however, low likelihood this is GI related.   Plan: Continue to trend  LFTs Will consider evaluation for autoimmune hepatitis if acute hep and tick borne illness panels are unrevealing. Continue to monitor Hgb in setting of anemia RUQ [place] ordered for further liver evaluation   LOS: 1 day    03/05/2021, 11:12 AM  Dallan Schonberg L. Alver Sorrow, MSN, APRN, AGNP-C Adult-Gerontology Nurse Practitioner [facility]   Addendum: Discussed with Dr. Abbey Chatters, will go ahead and proceed with checking AMA, ANA, ASMA and immunoglobulins to r/o autoimmune hepatitis.

## 2021-03-05 NOTE — Evaluation (Signed)
Physical Therapy Evaluation Patient Details Name: Joan Peterson MRN: [id number] DOB: 12-18-1985 Today's Date: 03/05/2021   History of Present Illness  Joan Peterson is a 35 y.o. female with medical history significant for HSV 2 infection.  Patient presented to the ED with complaints of passing out today.  She was standing in the bathroom, she felt dizzy and then she fell into the bathtub.  Her mother was present.  Patient reports she hit her head.  Patient reports over the past 3 weeks she has been feeling ill.  She reports vomiting, diarrhea, generalized weakness, sore throat, cough, fevers.  She reports about 3 episodes of vomiting and 3 episodes of diarrhea today.  No apparent blood in stool or vomitus, but she reports she has been seeing black stools over the past 4 days.  Since she was prescribed ciprofloxacin.  She denies any NSAID use.  She reports using Tylenol just once about a week ago.  She denies usage before or after.  Patient also has 2 ?? bug bites on the finger of her right hand-4 days ago, and on the side of her left thigh-2 days ago.  She reports she just woke up and saw it.  She lives in [place], she reports she does not live in the woods, and cannot report call any tick bites.  She denies drinking any alcohol.  No abdominal pain.  Patient is currently on her period.  No known contacts with similar illness.     This is patient's third visit in the ED for these symptoms.  She reports she has passed out twice.  Patient second visit to the ED she was started on a course of ciprofloxacin.   Clinical Impression   Patient exhibits generalized weakness, reduced functional activity tolerance, increased need for caregiver assistance, balance deficits, reduced ability to safely ambulate, and decreased exercise tolerance indicating need for PT services to improve functional independence and mobility to reduce risk for falls.      Follow Up Recommendations Home health PT;Supervision for  mobility/OOB    Equipment Recommendations  Rolling walker with 5" wheels    Recommendations for Other Services       Precautions / Restrictions        Mobility  Bed Mobility Overal bed mobility: Modified Independent             General bed mobility comments: increased time Patient Response: Cooperative  Transfers Overall transfer level: Needs assistance Equipment used: Rolling walker (2 wheeled) Transfers: Sit to/from [employer] Sit to Stand: Min guard Stand pivot transfers: Min guard       General transfer comment: unsteadiness  Ambulation/Gait Ambulation/Gait assistance: Min guard Gait Distance (Feet): 25 Feet Assistive device: Rolling walker (2 wheeled) Gait Pattern/deviations: Step-through pattern;Decreased step length - left;Decreased step length - right     General Gait Details: very slow, but steady during turns  Stairs Stairs:  (not attempted due to feeling unwell)          Wheelchair Mobility    Modified Rankin (Stroke Patients Only)       Balance Overall balance assessment: Mild deficits observed, not formally tested                                           Pertinent Vitals/Pain Pain Assessment: No/denies pain    Home Living Family/patient expects to be discharged to:: Private  residence Living Arrangements: Children Available Help at Discharge: Family Type of Home: House Home Access: Level entry     Home Layout: One level Home Equipment: None      Prior Function Level of Independence: Independent               Hand Dominance        Extremity/Trunk Assessment   Upper Extremity Assessment Upper Extremity Assessment: Defer to OT evaluation    Lower Extremity Assessment Lower Extremity Assessment: Generalized weakness       Communication   Communication: No difficulties  Cognition Arousal/Alertness: Awake/alert Behavior During Therapy: WFL for tasks  assessed/performed Overall Cognitive Status: Within Functional Limits for tasks assessed                                        General Comments      Exercises     Assessment/Plan    PT Assessment Patient needs continued PT services  PT Problem List Decreased strength;Decreased activity tolerance;Decreased balance;Decreased knowledge of use of DME;Decreased mobility;Cardiopulmonary status limiting activity       PT Treatment Interventions DME instruction;Gait training;Stair training;Functional mobility training;Therapeutic activities;Patient/family education;Balance training;Therapeutic exercise    PT Goals (Current goals can be found in the Care Plan section)  Acute Rehab PT Goals Patient Stated Goal: "Get feeling better, I don't have any energy" PT Goal Formulation: With patient Time For Goal Achievement: 03/12/21 Potential to Achieve Goals: Good    Frequency Min 3X/week   Barriers to discharge        Co-evaluation               AM-PAC PT "6 Clicks" Mobility  Outcome Measure Help needed turning from your back to your side while in a flat bed without using bedrails?: None Help needed moving from lying on your back to sitting on the side of a flat bed without using bedrails?: None Help needed moving to and from a bed to a chair (including a wheelchair)?: A Little Help needed standing up from a chair using your arms (e.g., wheelchair or bedside chair)?: A Little Help needed to walk in hospital room?: A Little Help needed climbing 3-5 steps with a railing? : A Lot 6 Click Score: 19    End of Session Equipment Utilized During Treatment: Gait belt Activity Tolerance: Patient limited by fatigue Patient left: in bed;with call bell/phone within reach;with bed alarm set Nurse Communication: Mobility status PT Visit Diagnosis: Unsteadiness on feet (R26.81);Muscle weakness (generalized) (M62.81);Difficulty in walking, not elsewhere classified (R26.2)     Time: 0900-0920 PT Time Calculation (min) (ACUTE ONLY): 20 min   Charges:   PT Evaluation $PT Eval Low Complexity: 1 Low PT Treatments $Gait Training: 8-22 mins       9:49 AM, 03/05/21 M. Shary Decamp, PT, DPT Physical Therapist- [place] Office Number: [phone]

## 2021-03-05 NOTE — Plan of Care (Signed)
  Problem: Acute Rehab PT Goals(only PT should resolve) Goal: Patient Will Transfer Sit To/From Stand Outcome: Progressing Flowsheets (Taken 03/05/2021 0950) Patient will transfer sit to/from stand: Independently Goal: Pt Will Transfer Bed To Chair/Chair To Bed Outcome: Progressing Flowsheets (Taken 03/05/2021 0950) Pt will Transfer Bed to Chair/Chair to Bed: Independently Goal: Pt Will Ambulate Outcome: Progressing Flowsheets (Taken 03/05/2021 0950) Pt will Ambulate:  > 125 feet  with modified independence  with rolling walker Goal: Pt Will Go Up/Down Stairs Outcome: Progressing Flowsheets (Taken 03/05/2021 0950) Pt will Go Up / Down Stairs:  1-2 stairs  with supervision  9:51 AM, 03/05/21 M. Shary Decamp, PT, DPT Physical Therapist- [place] Office Number: [phone]

## 2021-03-06 DIAGNOSIS — D61818 Other pancytopenia: Secondary | ICD-10-CM

## 2021-03-06 DIAGNOSIS — N179 Acute kidney failure, unspecified: Secondary | ICD-10-CM

## 2021-03-06 DIAGNOSIS — R7989 Other specified abnormal findings of blood chemistry: Secondary | ICD-10-CM

## 2021-03-06 DIAGNOSIS — R7401 Elevation of levels of liver transaminase levels: Secondary | ICD-10-CM

## 2021-03-06 DIAGNOSIS — D696 Thrombocytopenia, unspecified: Secondary | ICD-10-CM

## 2021-03-06 LAB — CBC WITH DIFFERENTIAL/PLATELET
Abs Immature Granulocytes: 0.01 10*3/uL (ref 0.00–0.07)
Basophils Absolute: 0 10*3/uL (ref 0.0–0.1)
Basophils Relative: 0 %
Eosinophils Absolute: 0 10*3/uL (ref 0.0–0.5)
Eosinophils Relative: 0 %
HCT: 30.9 % — ABNORMAL LOW (ref 36.0–46.0)
Hemoglobin: 9.4 g/dL — ABNORMAL LOW (ref 12.0–15.0)
Immature Granulocytes: 0 %
Lymphocytes Relative: 37 %
Lymphs Abs: 0.9 10*3/uL (ref 0.7–4.0)
MCH: 24.7 pg — ABNORMAL LOW (ref 26.0–34.0)
MCHC: 30.4 g/dL (ref 30.0–36.0)
MCV: 81.1 fL (ref 80.0–100.0)
Monocytes Absolute: 0.3 10*3/uL (ref 0.1–1.0)
Monocytes Relative: 12 %
Neutro Abs: 1.2 10*3/uL — ABNORMAL LOW (ref 1.7–7.7)
Neutrophils Relative %: 51 %
Platelets: 93 10*3/uL — ABNORMAL LOW (ref 150–400)
RBC: 3.81 MIL/uL — ABNORMAL LOW (ref 3.87–5.11)
RDW: 15.7 % — ABNORMAL HIGH (ref 11.5–15.5)
WBC: 2.5 10*3/uL — ABNORMAL LOW (ref 4.0–10.5)
nRBC: 0 % (ref 0.0–0.2)

## 2021-03-06 LAB — COMPREHENSIVE METABOLIC PANEL
ALT: 115 U/L — ABNORMAL HIGH (ref 0–44)
AST: 515 U/L — ABNORMAL HIGH (ref 15–41)
Albumin: 2.4 g/dL — ABNORMAL LOW (ref 3.5–5.0)
Alkaline Phosphatase: 49 U/L (ref 38–126)
Anion gap: 7 (ref 5–15)
BUN: 50 mg/dL — ABNORMAL HIGH (ref 6–20)
CO2: 22 mmol/L (ref 22–32)
Calcium: 7.2 mg/dL — ABNORMAL LOW (ref 8.9–10.3)
Chloride: 107 mmol/L (ref 98–111)
Creatinine, Ser: 4.25 mg/dL — ABNORMAL HIGH (ref 0.44–1.00)
GFR, Estimated: 13 mL/min — ABNORMAL LOW (ref >60)
Glucose, Bld: 100 mg/dL — ABNORMAL HIGH (ref 70–99)
Potassium: 3.9 mmol/L (ref 3.5–5.1)
Sodium: 136 mmol/L (ref 135–145)
Total Bilirubin: 0.9 mg/dL (ref 0.3–1.2)
Total Protein: 5.6 g/dL — ABNORMAL LOW (ref 6.5–8.1)

## 2021-03-06 LAB — URINALYSIS, COMPLETE (UACMP) WITH MICROSCOPIC
Bilirubin Urine: NEGATIVE
Glucose, UA: NEGATIVE mg/dL
Ketones, ur: NEGATIVE mg/dL
Nitrite: NEGATIVE
Protein, ur: 100 mg/dL — AB
Specific Gravity, Urine: 1.01 (ref 1.005–1.030)
pH: 6 (ref 5.0–8.0)

## 2021-03-06 LAB — PROTEIN / CREATININE RATIO, URINE
Creatinine, Urine: 111.64 mg/dL
Protein Creatinine Ratio: 0.9 mg/mg{Cre} — ABNORMAL HIGH (ref 0.00–0.15)
Total Protein, Urine: 100 mg/dL

## 2021-03-06 LAB — PROTIME-INR
INR: 1.1 (ref 0.8–1.2)
Prothrombin Time: 13.7 seconds (ref 11.4–15.2)

## 2021-03-06 LAB — ANA: Anti Nuclear Antibody (ANA): NEGATIVE

## 2021-03-06 LAB — IGG, IGA, IGM
IgA: 238 mg/dL (ref 87–352)
IgG (Immunoglobin G), Serum: 1299 mg/dL (ref 586–1602)
IgM (Immunoglobulin M), Srm: 65 mg/dL (ref 26–217)

## 2021-03-06 LAB — CREATININE, URINE, RANDOM: Creatinine, Urine: 112.71 mg/dL

## 2021-03-06 LAB — LYME DISEASE SEROLOGY W/REFLEX: Lyme Total Antibody EIA: NEGATIVE

## 2021-03-06 LAB — MAGNESIUM: Magnesium: 2.5 mg/dL — ABNORMAL HIGH (ref 1.7–2.4)

## 2021-03-06 LAB — SODIUM, URINE, RANDOM: Sodium, Ur: 46 mmol/L

## 2021-03-06 MED ORDER — BOOST / RESOURCE BREEZE PO LIQD CUSTOM
1.0000 | Freq: Three times a day (TID) | ORAL | Status: DC
  Administered 2021-03-07: 1 via ORAL

## 2021-03-06 NOTE — Progress Notes (Signed)
Initial Nutrition Assessment  DOCUMENTATION CODES:   Morbid obesity  INTERVENTION:  Boost Breeze po TID, each supplement provides 250 kcal and 9 grams of protein    NUTRITION DIAGNOSIS:   Inadequate oral intake related to nausea, vomiting as evidenced by per patient/family report (refusing meals due to nausea).   GOAL:  Patient will meet greater than or equal to 90% of their needs  MONITOR:  PO intake, Weight trends, I & O's, Labs  REASON FOR ASSESSMENT:   Malnutrition Screening Tool    ASSESSMENT: Patient is a 35 yo female with GERD, depression and more recently sore throat. Complaining of N/V/D and weakness. AKI, volume depletion at admission. Hx of HSV-2 and UTI. HIV + antibody, further test pending per MD.   Patient unable to tolerate breakfast this morning due to nausea. Talked with nursing. Patient continues to report vomiting and nausea. Refused breakfast and didn't eat yesterday.  Weight history reviewed. Patient not feeling well enough to discuss today. RD will continue to follow and make further recommendations as her care progresses.   Labs: BMP Latest Ref Rng & Units 03/06/2021 03/05/2021 03/04/2021  Glucose 70 - 99 mg/dL 388(E) 280(K) 349(Z)  BUN 6 - 20 mg/dL 79(X) 50(V) 69(V)  Creatinine 0.44 - 1.00 mg/dL 9.48(A) 1.65(V) 3.74(M)  Sodium 135 - 145 mmol/L 136 133(L) 130(L)  Potassium 3.5 - 5.1 mmol/L 3.9 3.7 3.8  Chloride 98 - 111 mmol/L 107 103 102  CO2 22 - 32 mmol/L 22 21(L) 19(L)  Calcium 8.9 - 10.3 mg/dL 7.2(L) 7.3(L) 7.4(L)       NUTRITION - FOCUSED PHYSICAL EXAM:  Unable to complete Nutrition-Focused physical exam at this time.     Diet Order:   Diet Order             Diet regular Room service appropriate? Yes; Fluid consistency: Thin  Diet effective now                   EDUCATION NEEDS:  Not appropriate for education at this time  Skin:  Skin Assessment: Reviewed RN Assessment  Last BM:  6/26  Height:   Ht Readings from Last 1  Encounters:  03/04/21 5\' 2"  (1.575 m)    Weight:   Wt Readings from Last 1 Encounters:  03/04/21 117.9 kg    Ideal Body Weight:   50 kg  BMI:  Body mass index is 47.55 kg/m.  Estimated Nutritional Needs:   Kcal:  1700-1900  Protein:  100-115 gr  Fluid:  >2 liters daily   03/06/21 MS,RD,CSG,LDN Contact: Royann Shivers

## 2021-03-06 NOTE — Consult Note (Signed)
Reason for Consult: AKI Referring Physician: Laural Benes, MD  Joan Peterson is an 35 y.o. female with a PMH significant for depression and GERD presented to an outside hospital 3 weeks ago complaining of a sore throat.  She was given antibiotics but reports that she did not improve and developed fever, malaise, nausea, vomiting, and diarrhea.  She presented to the ED 4 times in the last 2 weeks.   She was brought to [facility] ED via EMS after she "blacked out" in the bathroom.  She was found to be diaphoretic and hypotensive.  She was started on IVF's and transported to the ED where she was found to have orthostatic hypotension (BP of 97/72 dropping to 89/50 upon standing), Temp 98.6, labs notable for K 3, Na 130, AST 779, ALT 129, alb 2.8, WBC 2.9, platelets 98, BUN 33, Cr 3.04.  She was admitted for IVF's and further evaluation.  We were consulted due to the development of worsening AKI.  The trend in Scr is seen below.  She denies any dysuria, pyuria, hematuria, urgency, frequency, retention, or history of nephrolithiasis.  She also denies any NSAIDs, Cox-II I's, Etoh, illicit drugs, or history of kidney disease.  She does have a painful pustule on her left thigh and right 3rd finger.  Denies any tick bites or recent travel.   Trend in Creatinine: Creatinine, Ser  Date/Time Value Ref Range Status  03/06/2021 03:58 AM 4.25 (H) 0.44 - 1.00 mg/dL Final  72/53/6644 03:47 AM 3.53 (H) 0.44 - 1.00 mg/dL Final  42/59/5638 75:64 PM 3.04 (H) 0.44 - 1.00 mg/dL Final  33/29/5188 41:66 AM 1.44 (H) 0.44 - 1.00 mg/dL Final  03/08/1600 09:32 PM 1.30 (H) 0.44 - 1.00 mg/dL Final    PMH:   Past Medical History:  Diagnosis Date   HSV-2 infection    Urinary tract infection     PSH:   Past Surgical History:  Procedure Laterality Date   CESAREAN SECTION     NO PAST SURGERIES     TUBAL LIGATION      Allergies:  Allergies  Allergen Reactions   Ibuprofen Hives   Penicillins Hives    Medications:   Prior to  Admission medications   Medication Sig Start Date End Date Taking? Authorizing Provider  ciprofloxacin (CIPRO) 500 MG tablet Take 500 mg by mouth 2 (two) times daily.   Yes [provider]  omeprazole (PRILOSEC) 40 MG capsule Take 1 capsule by mouth daily. 02/24/21  Yes [provider]  acetaminophen (TYLENOL) 325 MG tablet Take 650 mg by mouth every 6 (six) hours as needed for mild pain or headache. Patient not taking: Reported on 03/04/2021    [provider]  citalopram (CELEXA) 10 MG tablet Take 10 mg by mouth daily. 02/24/21   [provider]  ferrous sulfate 325 (65 FE) MG tablet Take 325 mg by mouth daily. Patient not taking: Reported on 03/04/2021 02/24/21   [provider]    Inpatient medications:   Discontinued Meds:   Medications Discontinued During This Encounter  Medication Reason   ciprofloxacin (CIPRO) 500 MG tablet Error   0.9 %  sodium chloride infusion     Social History:  reports that she quit smoking about 3 weeks ago. Her smoking use included cigarettes. She smoked an average of 0.25 packs per day. She has never used smokeless tobacco. She reports that she does not drink alcohol and does not use drugs.  Family History:   Family History  Problem  Relation Age of Onset   Hypertension Father    Hearing loss Father     Pertinent items are noted in HPI. Weight change:   Intake/Output Summary (Last 24 hours) at 03/06/2021 0852 Last data filed at 03/06/2021 0300 Gross per 24 hour  Intake 1260.86 ml  Output --  Net 1260.86 ml   BP 108/76 (BP Location: Left Arm)   Pulse 76   Temp 97.7 F (36.5 C) (Oral)   Resp 18   Ht 5\' 2"  (1.575 m)   Wt 117.9 kg   LMP 02/27/2021   SpO2 100%   BMI 47.55 kg/m  Vitals:   03/05/21 0942 03/05/21 1245 03/05/21 2122 03/06/21 0547  BP:  (!) 104/57 (!) 92/49 108/76  Pulse:  77 76 76  Resp:  20 18 18   Temp:  97.8 F (36.6 C) 97.7 F (36.5 C) 97.7 F (36.5 C)  TempSrc:  Oral Oral  Oral  SpO2: 99% 100% 100% 100%  Weight:      Height:         General appearance: alert, cooperative, and no distress Head: Normocephalic, without obvious abnormality, atraumatic Eyes: negative findings: lids and lashes normal, conjunctivae and sclerae normal, and corneas clear Resp: clear to auscultation bilaterally Cardio: regular rate and rhythm, S1, S2 normal, no murmur, click, rub or gallop GI: soft, non-tender; bowel sounds normal; no masses,  no organomegaly Extremities: extremities normal, atraumatic, no cyanosis or edema and pustule on left thigh, indurated and tender, small similar lesion on her right 3rd finger  Labs: Basic Metabolic Panel: Recent Labs  Lab 02/28/21 0957 03/04/21 1701 03/05/21 0518 03/06/21 0358  NA 134* 130* 133* 136  K 3.2* 3.8 3.7 3.9  CL 104 102 103 107  CO2 22 19* 21* 22  GLUCOSE 119* 144* 119* 100*  BUN 12 33* 40* 50*  CREATININE 1.44* 3.04* 3.53* 4.25*  ALBUMIN 3.1* 2.8* 2.5* 2.4*  CALCIUM 7.5* 7.4* 7.3* 7.2*   Liver Function Tests: Recent Labs  Lab 03/04/21 1701 03/05/21 0518 03/06/21 0358  AST 779* 678* 515*  ALT 129* 116* 115*  ALKPHOS 58 53 49  BILITOT 0.8 0.9 0.9  PROT 6.5 5.9* 5.6*  ALBUMIN 2.8* 2.5* 2.4*   No results for input(s): LIPASE, AMYLASE in the last 168 hours. Recent Labs  Lab 03/04/21 2019  AMMONIA 25   CBC: Recent Labs  Lab 02/28/21 0957 03/04/21 1701 03/05/21 0518 03/06/21 0358  WBC 2.9* 2.9* 2.1* 2.5*  NEUTROABS 2.1 1.9  --  1.2*  HGB 11.2* 11.9* 10.6* 9.4*  HCT 37.9 37.9 34.7* 30.9*  MCV 82.4 77.5* 78.9* 81.1  PLT 190 98* 90* 93*   PT/INR: @LABRCNTIP (inr:5) Cardiac Enzymes: )No results for input(s): CKTOTAL, CKMB, CKMBINDEX, TROPONINI in the last 168 hours. CBG: No results for input(s): GLUCAP in the last 168 hours.  Iron Studies:  Recent Labs  Lab 03/04/21 1701  IRON 25*  TIBC 314  FERRITIN 2,051*    Xrays/Other Studies: DG Chest Portable 1 View  Result Date: 03/04/2021 CLINICAL  DATA:  Productive cough EXAM: PORTABLE CHEST 1 VIEW COMPARISON:  Radiograph 02/26/2021 FINDINGS: No consolidation, features of edema, pneumothorax, or effusion. Pulmonary vascularity is normally distributed. The cardiomediastinal contours are unremarkable. No acute osseous or soft tissue abnormality. Telemetry leads overlie the chest. IMPRESSION: No acute cardiopulmonary abnormality. Electronically Signed   By: 11-20-1968 M.D.   On: 03/04/2021 17:15   02/28/2021 Abdomen Limited RUQ (LIVER/GB)  Result Date: 03/05/2021 CLINICAL DATA:  Transaminitis EXAM: ULTRASOUND ABDOMEN  LIMITED RIGHT UPPER QUADRANT COMPARISON:  CT abdomen pelvis 02/28/2021 FINDINGS: Gallbladder: No gallstones or wall thickening visualized. No sonographic Murphy sign noted by sonographer. Common bile duct: Diameter: 4 mm Liver: No focal lesion identified. Within normal limits in parenchymal echogenicity. Portal vein is patent on color Doppler imaging with normal direction of blood flow towards the liver. Other: None. IMPRESSION: No significant sonographic abnormality of the liver or gallbladder Electronically Signed   By: Acquanetta Belling M.D.   On: 03/05/2021 15:25     Assessment/Plan:  AKI - in setting of hypotension, volume depletion, pancytopenia, and abnormal LFT's.  Possible ischemic ATN but need to rule out systemic vasculitis/acute GN.  She had been treated with abx for pharyngitis 3 weeks ago so AIN or post-infectious GN on DDx as well as ANCA.  Starting to make urine and will need to send studies.  CT of abdomen done last week was unrevealing.  Tickborne diseases panel pending. Abnormal LFT's- workup underway and GI consulted Pancytopenia - tickborne disease panel pending.  Recommend ID and Heme consultation. Orthostatic hypotension - receiving IVF's. Pustule on left thigh - has been treated with abx per primary svc. HIV + antibody test.  Will need to be confirmed.   Joan Peterson 03/06/2021, 8:52 AM

## 2021-03-06 NOTE — Progress Notes (Signed)
Physical Therapy Treatment Patient Details Name: Joan Peterson MRN: [id number] DOB: 1986/02/23 Today's Date: 03/06/2021    History of Present Illness Joan Peterson is a 35 y.o. female who presents with c/o passing out today. Patient reports over the past 3 weeks she has been feeling ill.  She reports vomiting, diarrhea, generalized weakness, sore throat, cough, fevers. PMH: HSV 2 infection.    PT Comments    Pt sleeping upon arrival, but agreeable to PT. Pt ambulates 40 ft in room with RW, good steadiness, no LOB with turns or passing furniture, reports fatigue and requests to return to supine to nap due to tired. Pt would benefit from OPPT and pt in agreement, also reports doesn't want HHPT because has protective dog in her home. Educated pt on ambulation without RW, but pt declines, reports feeling more secure with it so ambulation performed with RW- will attempt gait without RW next session as able. Will continue to progress acute PT as able.     Follow Up Recommendations  Outpatient PT     Equipment Recommendations  Rolling walker with 5" wheels    Recommendations for Other Services       Precautions / Restrictions Precautions Precautions: None Restrictions Weight Bearing Restrictions: No    Mobility  Bed Mobility Overal bed mobility: Modified Independent     Transfers Overall transfer level: Modified independent Equipment used: Rolling walker (2 wheeled)  General transfer comment: STS, BUE assisting to power to stand, prefers RW for comfort  Ambulation/Gait Ambulation/Gait assistance: Supervision Gait Distance (Feet): 40 Feet Assistive device: Rolling walker (2 wheeled) Gait Pattern/deviations: Step-through pattern;Decreased stride length Gait velocity: decreased   General Gait Details: pt ambulates in room, completing turns, navigating past furniture, no LOB or unsteadiness, supv for safety, limited by fatigue   Stairs             Wheelchair  Mobility    Modified Rankin (Stroke Patients Only)       Balance Overall balance assessment: No apparent balance deficits (not formally assessed)           Cognition Arousal/Alertness: Awake/alert Behavior During Therapy: WFL for tasks assessed/performed Overall Cognitive Status: Within Functional Limits for tasks assessed         Exercises General Exercises - Lower Extremity Hip Flexion/Marching: Standing;AROM;Strengthening;Both;15 reps    General Comments General comments (skin integrity, edema, etc.): HR 97 max noted with ambulation      Pertinent Vitals/Pain Pain Assessment: No/denies pain    Home Living                      Prior Function            PT Goals (current goals can now be found in the care plan section) Acute Rehab PT Goals Patient Stated Goal: "Get feeling better, I don't have any energy" PT Goal Formulation: With patient Time For Goal Achievement: 03/12/21 Potential to Achieve Goals: Good Progress towards PT goals: Progressing toward goals    Frequency    Min 3X/week      PT Plan Discharge plan needs to be updated    Co-evaluation              AM-PAC PT "6 Clicks" Mobility   Outcome Measure  Help needed turning from your back to your side while in a flat bed without using bedrails?: None Help needed moving from lying on your back to sitting on the side of a flat bed without using  bedrails?: None Help needed moving to and from a bed to a chair (including a wheelchair)?: A Little Help needed standing up from a chair using your arms (e.g., wheelchair or bedside chair)?: A Little Help needed to walk in hospital room?: A Little Help needed climbing 3-5 steps with a railing? : A Little 6 Click Score: 20    End of Session   Activity Tolerance: Patient limited by fatigue Patient left: in bed;with call bell/phone within reach;with bed alarm set Nurse Communication: Mobility status PT Visit Diagnosis: Unsteadiness on  feet (R26.81);Muscle weakness (generalized) (M62.81);Difficulty in walking, not elsewhere classified (R26.2)     Time: 9233-0076 PT Time Calculation (min) (ACUTE ONLY): 13 min  Charges:  $Gait Training: 8-22 mins                      Tori Annemarie Sebree PT, DPT 03/06/21, 2:54 PM

## 2021-03-06 NOTE — Progress Notes (Signed)
Subjective: Patient doing well. Denies pain at this time, report one episode of diarrhea this morning and reports continued cough as well as alteration in her taste. Denies nausea, vomiting or abdominal pain.   Objective: Vital signs in last 24 hours: Temp:  [97.7 F (36.5 C)-97.8 F (36.6 C)] 97.7 F (36.5 C) (06/28 0547) Pulse Rate:  [76-77] 76 (06/28 0547) Resp:  [18-20] 18 (06/28 0547) BP: (92-108)/(49-76) 108/76 (06/28 0547) SpO2:  [99 %-100 %] 100 % (06/28 0547) Last BM Date: 03/04/21 General:   Alert and oriented, pleasant Head:  Normocephalic and atraumatic. Heart:  S1, S2 present, no murmurs noted.  Lungs: Clear to auscultation bilaterally, without wheezing, rales, or rhonchi.  Abdomen:  Bowel sounds present, soft, non-tender, non-distended. No HSM or hernias noted. No rebound or guarding. No masses appreciated  Msk:  Symmetrical without gross deformities. Normal posture. Pulses:  Normal pulses noted. Extremities:  Without edema. Neurologic:  Alert and  oriented x4;  grossly normal neurologically. Skin:  Warm and dry. Insect bite to R hand and to L thigh. Psych:  Alert and cooperative. Normal mood and affect.  Intake/Output from previous day: 06/27 0701 - 06/28 0700 In: 1260.9 [P.O.:360; I.V.:900.9] Out: -  Intake/Output this shift: No intake/output data recorded.  Lab Results: Recent Labs    03/04/21 1701 03/05/21 0518 03/06/21 0358  WBC 2.9* 2.1* 2.5*  HGB 11.9* 10.6* 9.4*  HCT 37.9 34.7* 30.9*  PLT 98* 90* 93*   BMET Recent Labs    03/04/21 1701 03/05/21 0518 03/06/21 0358  NA 130* 133* 136  K 3.8 3.7 3.9  CL 102 103 107  CO2 19* 21* 22  GLUCOSE 144* 119* 100*  BUN 33* 40* 50*  CREATININE 3.04* 3.53* 4.25*  CALCIUM 7.4* 7.3* 7.2*   LFT Recent Labs    03/04/21 1701 03/05/21 0518 03/06/21 0358  PROT 6.5 5.9* 5.6*  ALBUMIN 2.8* 2.5* 2.4*  AST 779* 678* 515*  ALT 129* 116* 115*  ALKPHOS 58 53 49  BILITOT 0.8 0.9 0.9   PT/INR Recent  Labs    03/04/21 2018 03/05/21 0518  LABPROT 13.4 13.2  INR 1.0 1.0   Hepatitis Panel Recent Labs    03/04/21 2018  HEPBSAG NON REACTIVE  HCVAB NON REACTIVE  HEPAIGM NON REACTIVE  HEPBIGM NON REACTIVE    Studies/Results: DG Chest Portable 1 View  Result Date: 03/04/2021 CLINICAL DATA:  Productive cough EXAM: PORTABLE CHEST 1 VIEW COMPARISON:  Radiograph 02/26/2021 FINDINGS: No consolidation, features of edema, pneumothorax, or effusion. Pulmonary vascularity is normally distributed. The cardiomediastinal contours are unremarkable. No acute osseous or soft tissue abnormality. Telemetry leads overlie the chest. IMPRESSION: No acute cardiopulmonary abnormality. Electronically Signed   By: Lovena Le M.D.   On: 03/04/2021 17:15   US Abdomen Limited RUQ (LIVER/GB)  Result Date: 03/05/2021 CLINICAL DATA:  Transaminitis EXAM: ULTRASOUND ABDOMEN LIMITED RIGHT UPPER QUADRANT COMPARISON:  CT abdomen pelvis 02/28/2021 FINDINGS: Gallbladder: No gallstones or wall thickening visualized. No sonographic Murphy sign noted by sonographer. Common bile duct: Diameter: 4 mm Liver: No focal lesion identified. Within normal limits in parenchymal echogenicity. Portal vein is patent on color Doppler imaging with normal direction of blood flow towards the liver. Other: None. IMPRESSION: No significant sonographic abnormality of the liver or gallbladder Electronically Signed   By: Miachel Roux M.D.   On: 03/05/2021 15:25    Assessment: Joan Peterson is a 35 year old female with medical history significant for HSV2 infection, admitted on 6/26 for syncope,  hypotension, transaminitis and AKI. Seen initially yesterday by our service for transaminitis.   LFTs continue to trend down, AST now 515 and ALT 115, down from 678 and 116 respectively, yesterday. Alk phos remains WNL, as does total bilirubin. Albumin stable, but, low at 2.4.   RUQ US done yesterday was unremarkable for focal hepatic findings.   Acute  hepatitis panel negative.   Extensive serologies, including, autoimmune hepatitis panel, RMSF, Erlichia and B. Burgdorfi still pending.  Differentials include secondary hepatic ischemia in setting of acute viral illness and autoimmune hepatitis.   Plan: Continue to trend LFTs 2. If patient has persistent diarrhea, 3 or more episodes per day, consider stool studies.  3. Will continue to follow for results of pending autoimmune hepatitis panel 4. Will recheck INR    LOS: 2 days    03/06/2021, 8:37 AM  Joan Peterson L. Alver Sorrow, MSN, APRN, AGNP-C Adult-Gerontology Nurse Practitioner [facility]

## 2021-03-06 NOTE — Progress Notes (Signed)
PROGRESS NOTE    Joan Peterson  QQV:[id number] DOB: June 18, 1986 DOA: 03/04/2021 PCP: Rema Jasmine, NP   Brief Narrative: Joan Peterson is a 35 yo female with medical history significant for HSV2 infection who was admitted 6/26 for syncope, hypostatic hypotension, elevated AST (in the 700s) and AKI. Nephrology and gastroenterology are following to manage elevated LFTs and worsening AKI. Workup evaluating for tickborne illnesses, nephrotic/nephritic syndromes, and autoimmune hepatitis were started with pending results. Infectious workup revealed reactive HIV screen 6/28. Patient had risk factors including high-risk sexual practices and past diagnosis of Chlamydia (2012). Confirmatory HIV test ordered and pending 6/28.   Assessment & Plan:   Principal Problem:   AKI (acute kidney injury) (HCC) Active Problems:   Transaminitis   Syncope, vasovagal   Thrombocytopenia (HCC)  #AKI - in setting of hypotension, volume depletion, pancytopenia, and abnormal LFT's. Ddx includes ischemic ATN, systemic vasculitis/acute GN, AIN, or postinfectious GN (patient was treated for pharyngitis with abx 3 weeks ago), ANCA. BUN/Cr continues to increase to 50/4.25 (40/3.53 yesterday).  -Nephro consultation requested and appreciate the recommendations.  -Workup including ANCA titers, Anti-DNA Ab, ASO titer, GBM Ab, Complement, Cryoglobulin, C3/C4 pending  - Daily CMP  #Elevated AST/ALT - in setting of hypotension, AKI, pancytopenia. Ddx includes autoimmune hepatitis vs secondary hepatic ischemia in setting of acute viral illness. AST/ALT trending down but still elevated (575/115) Given positive HIV screen (pending confirmatory Antibody test), opportunistic infections are on the differential.  - GI following; appreciate recs - Per GI recs, if patient has persistent diarrhea (3 or more episodes/day), consider stool studies - F/u autoimmune hepatitis panel - F/u INR  #Pancytopenia - WBC 2.1, HgB 10.6, PLT 90 in  setting of positive HIV screen. Etiologies of pancytopenia are broad and involves processes of bone marrow replacement (malignancy, myelofibrosis), BM failure (medications, autoimmune disorders -SLE, RA, sarcoid, or nutrition factors - B12, alcohol use), or destruction of cells due to cirrhosis, infections (EBV). Given acute onset, considering infection, especially in the setting of positive HIV screen vs malignancy. - Plan to call and discuss case with Hem/onc - Daily CBC  #Positive HIV screen - HIV-1/2 Ab pending - Order RPR, GC/CT  - Plan to consult with ID pending results of confirmatory testing  DVT prophylaxis: SCDs Code Status: Full  Family Communication: plan of care discussed in detail with patient and she verbalized understanding Disposition Plan: anticipating home when medically stabilized   Consultants:  Colodonato (Nephro) Carlan (GI)  Procedures: n/a  Antimicrobials: n/a   Subjective: Patient seen by bedside. Discussed positive HIV screen results. No complaint overnight. Nausea improved. Revealed that patient was having unprotected intercourse with a formerly incarcerated person, suggesting high risk of contracting HIV/STI  Objective: Vitals:   03/05/21 0942 03/05/21 1245 03/05/21 2122 03/06/21 0547  BP:  (!) 104/57 (!) 92/49 108/76  Pulse:  77 76 76  Resp:  20 18 18   Temp:  97.8 F (36.6 C) 97.7 F (36.5 C) 97.7 F (36.5 C)  TempSrc:  Oral Oral Oral  SpO2: 99% 100% 100% 100%  Weight:      Height:        Intake/Output Summary (Last 24 hours) at 03/06/2021 0733 Last data filed at 03/06/2021 0300 Gross per 24 hour  Intake 1260.86 ml  Output --  Net 1260.86 ml   Filed Weights   03/04/21 1610  Weight: 117.9 kg    Examination:  General exam: Appears calm and comfortable  Respiratory system: Clear to auscultation. Respiratory effort normal. Cardiovascular  system: normal S1 & S2 heard.   No JVD, murmurs, rubs, gallops or clicks. No pedal  edema. Gastrointestinal system: Abdomen is nondistended, soft and nontender. No organomegaly or masses felt. Normal bowel sounds heard. Central nervous system: Alert and oriented. No focal neurological deficits. Extremities: Full ROM, bug bites on R finger and L thigh healing, forming scab. Psychiatry: Judgement and insight appear poor. Mood & affect appropriate.   Data Reviewed: I have personally reviewed following labs and imaging studies  CBC: Recent Labs  Lab 02/28/21 0957 03/04/21 1701 03/05/21 0518 03/06/21 0358  WBC 2.9* 2.9* 2.1* 2.5*  NEUTROABS 2.1 1.9  --  1.2*  HGB 11.2* 11.9* 10.6* 9.4*  HCT 37.9 37.9 34.7* 30.9*  MCV 82.4 77.5* 78.9* 81.1  PLT 190 98* 90* 93*   Basic Metabolic Panel: Recent Labs  Lab 02/28/21 0957 03/04/21 1701 03/05/21 0518 03/06/21 0358  NA 134* 130* 133* 136  K 3.2* 3.8 3.7 3.9  CL 104 102 103 107  CO2 22 19* 21* 22  GLUCOSE 119* 144* 119* 100*  BUN 12 33* 40* 50*  CREATININE 1.44* 3.04* 3.53* 4.25*  CALCIUM 7.5* 7.4* 7.3* 7.2*  MG  --   --   --  2.5*   GFR: Estimated Creatinine Clearance: 22.7 mL/min (A) (by C-G formula based on SCr of 4.25 mg/dL (H)). Liver Function Tests: Recent Labs  Lab 02/28/21 0957 03/04/21 1701 03/05/21 0518 03/06/21 0358  AST 74* 779* 678* 515*  ALT 43 129* 116* 115*  ALKPHOS 66 58 53 49  BILITOT 0.4 0.8 0.9 0.9  PROT 7.1 6.5 5.9* 5.6*  ALBUMIN 3.1* 2.8* 2.5* 2.4*   No results for input(s): LIPASE, AMYLASE in the last 168 hours. Recent Labs  Lab 03/04/21 2019  AMMONIA 25   Coagulation Profile: Recent Labs  Lab 03/04/21 2018 03/05/21 0518  INR 1.0 1.0   Cardiac Enzymes: No results for input(s): CKTOTAL, CKMB, CKMBINDEX, TROPONINI in the last 168 hours. BNP (last 3 results) No results for input(s): PROBNP in the last 8760 hours. HbA1C: No results for input(s): HGBA1C in the last 72 hours. CBG: No results for input(s): GLUCAP in the last 168 hours. Lipid Profile: No results for  input(s): CHOL, HDL, LDLCALC, TRIG, CHOLHDL, LDLDIRECT in the last 72 hours. Thyroid Function Tests: No results for input(s): TSH, T4TOTAL, FREET4, T3FREE, THYROIDAB in the last 72 hours. Anemia Panel: Recent Labs    03/04/21 1701 03/05/21 1030  VITAMINB12 365  --   FOLATE 15.4  --   FERRITIN 2,051*  --   TIBC 314  --   IRON 25*  --   RETICCTPCT  --  <0.4*    Recent Results (from the past 240 hour(s))  Resp Panel by RT-PCR (Flu A&B, Covid) Nasopharyngeal Swab     Status: None   Collection Time: 02/26/21  5:13 PM   Specimen: Nasopharyngeal Swab; Nasopharyngeal(NP) swabs in vial transport medium  Result Value Ref Range Status   SARS Coronavirus 2 by RT PCR NEGATIVE NEGATIVE Final    Comment: (NOTE) SARS-CoV-2 target nucleic acids are NOT DETECTED.  The SARS-CoV-2 RNA is generally detectable in upper respiratory specimens during the acute phase of infection. The lowest concentration of SARS-CoV-2 viral copies this assay can detect is 138 copies/mL. A negative result does not preclude SARS-Cov-2 infection and should not be used as the sole basis for treatment or other patient management decisions. A negative result may occur with  improper specimen collection/handling, submission of specimen other than nasopharyngeal  swab, presence of viral mutation(s) within the areas targeted by this assay, and inadequate number of viral copies(<138 copies/mL). A negative result must be combined with clinical observations, patient history, and epidemiological information. The expected result is Negative.  Fact Sheet for Patients:  EntrepreneurPulse.com.au  Fact Sheet for Healthcare Providers:  IncredibleEmployment.be  This test is no t yet approved or cleared by the [place] FDA and  has been authorized for detection and/or diagnosis of SARS-CoV-2 by FDA under an Emergency Use Authorization (EUA). This EUA will remain  in effect (meaning this test  can be used) for the duration of the COVID-19 declaration under Section 564(b)(1) of the Act, 21 U.S.C.section 360bbb-3(b)(1), unless the authorization is terminated  or revoked sooner.       Influenza A by PCR NEGATIVE NEGATIVE Final   Influenza B by PCR NEGATIVE NEGATIVE Final    Comment: (NOTE) The Xpert Xpress SARS-CoV-2/FLU/RSV plus assay is intended as an aid in the diagnosis of influenza from Nasopharyngeal swab specimens and should not be used as a sole basis for treatment. Nasal washings and aspirates are unacceptable for Xpert Xpress SARS-CoV-2/FLU/RSV testing.  Fact Sheet for Patients: EntrepreneurPulse.com.au  Fact Sheet for Healthcare Providers: IncredibleEmployment.be  This test is not yet approved or cleared by the [place] FDA and has been authorized for detection and/or diagnosis of SARS-CoV-2 by FDA under an Emergency Use Authorization (EUA). This EUA will remain in effect (meaning this test can be used) for the duration of the COVID-19 declaration under Section 564(b)(1) of the Act, 21 U.S.C. section 360bbb-3(b)(1), unless the authorization is terminated or revoked.  Performed at [facility], [address] [place], [place] [address]   Resp Panel by RT-PCR (Flu A&B, Covid) Nasopharyngeal Swab     Status: None   Collection Time: 03/04/21  6:11 PM   Specimen: Nasopharyngeal Swab; Nasopharyngeal(NP) swabs in vial transport medium  Result Value Ref Range Status   SARS Coronavirus 2 by RT PCR NEGATIVE NEGATIVE Final    Comment: (NOTE) SARS-CoV-2 target nucleic acids are NOT DETECTED.  The SARS-CoV-2 RNA is generally detectable in upper respiratory specimens during the acute phase of infection. The lowest concentration of SARS-CoV-2 viral copies this assay can detect is 138 copies/mL. A negative result does not preclude SARS-Cov-2 infection and should not be used as the sole basis for treatment or other patient  management decisions. A negative result may occur with  improper specimen collection/handling, submission of specimen other than nasopharyngeal swab, presence of viral mutation(s) within the areas targeted by this assay, and inadequate number of viral copies(<138 copies/mL). A negative result must be combined with clinical observations, patient history, and epidemiological information. The expected result is Negative.  Fact Sheet for Patients:  EntrepreneurPulse.com.au  Fact Sheet for Healthcare Providers:  IncredibleEmployment.be  This test is no t yet approved or cleared by the [place] FDA and  has been authorized for detection and/or diagnosis of SARS-CoV-2 by FDA under an Emergency Use Authorization (EUA). This EUA will remain  in effect (meaning this test can be used) for the duration of the COVID-19 declaration under Section 564(b)(1) of the Act, 21 U.S.C.section 360bbb-3(b)(1), unless the authorization is terminated  or revoked sooner.       Influenza A by PCR NEGATIVE NEGATIVE Final   Influenza B by PCR NEGATIVE NEGATIVE Final    Comment: (NOTE) The Xpert Xpress SARS-CoV-2/FLU/RSV plus assay is intended as an aid in the diagnosis of influenza from Nasopharyngeal swab specimens and should not  be used as a sole basis for treatment. Nasal washings and aspirates are unacceptable for Xpert Xpress SARS-CoV-2/FLU/RSV testing.  Fact Sheet for Patients: EntrepreneurPulse.com.au  Fact Sheet for Healthcare Providers: IncredibleEmployment.be  This test is not yet approved or cleared by the [place] FDA and has been authorized for detection and/or diagnosis of SARS-CoV-2 by FDA under an Emergency Use Authorization (EUA). This EUA will remain in effect (meaning this test can be used) for the duration of the COVID-19 declaration under Section 564(b)(1) of the Act, 21 U.S.C. section 360bbb-3(b)(1),  unless the authorization is terminated or revoked.  Performed at [facility], [address] [place], [place] [address]          Radiology Studies: DG Chest Portable 1 View  Result Date: 03/04/2021 CLINICAL DATA:  Productive cough EXAM: PORTABLE CHEST 1 VIEW COMPARISON:  Radiograph 02/26/2021 FINDINGS: No consolidation, features of edema, pneumothorax, or effusion. Pulmonary vascularity is normally distributed. The cardiomediastinal contours are unremarkable. No acute osseous or soft tissue abnormality. Telemetry leads overlie the chest. IMPRESSION: No acute cardiopulmonary abnormality. Electronically Signed   By: Lovena Le M.D.   On: 03/04/2021 17:15   US Abdomen Limited RUQ (LIVER/GB)  Result Date: 03/05/2021 CLINICAL DATA:  Transaminitis EXAM: ULTRASOUND ABDOMEN LIMITED RIGHT UPPER QUADRANT COMPARISON:  CT abdomen pelvis 02/28/2021 FINDINGS: Gallbladder: No gallstones or wall thickening visualized. No sonographic Murphy sign noted by sonographer. Common bile duct: Diameter: 4 mm Liver: No focal lesion identified. Within normal limits in parenchymal echogenicity. Portal vein is patent on color Doppler imaging with normal direction of blood flow towards the liver. Other: None. IMPRESSION: No significant sonographic abnormality of the liver or gallbladder Electronically Signed   By: Miachel Roux M.D.   On: 03/05/2021 15:25    Scheduled Meds: Continuous Infusions:  sodium chloride 100 mL/hr at 03/06/21 0121     LOS: 2 days   Time spent: 35 minutes   Fredrik Rigger, MD [facility] Pager [phone]  If 7PM-7AM, please contact night-coverage [email or web address] Password [facility] 03/06/2021, 7:33 AM  _________________________________________________________   Patient seen and examined with Trevor Iha, Medical student. In addition to supervising the encounter, I played a key role in the decision making process as well as reviewed key findings.  Evaluation for HIV with confirmatory  testing is pending.  With her rising creatinine I have requested a nephrology consultation and I appreciate the recommendations.  GI consultation also appreciated.  We may need to involve ID and plan to consult with hematology on 6/29 pending results of repeat labs.  Pt is NOT medically stable for discharge at this time and needs ongoing diagnostic testing and subspecialty consultation.  I agree with the documentation above and edited as needed.    Gerlene Fee, MD FAAFP Attending Physician [facility] How to contact the [facility] Attending or Consulting provider [place] or covering provider during after hours [place], for this patient?  Check the care team in [facility] and look for a) attending/consulting [facility] provider listed and b) the [facility] team listed Log into [email or web address] and use [place]'s universal password to access. If you do not have the password, please contact the hospital operator. Locate the [facility] provider you are looking for under [facility] and page to a number that you can be directly reached. If you still have difficulty reaching the provider, please page the [facility] (Director on Call) for the Hospitalists listed on amion for assistance.

## 2021-03-07 DIAGNOSIS — N179 Acute kidney failure, unspecified: Secondary | ICD-10-CM

## 2021-03-07 DIAGNOSIS — R7989 Other specified abnormal findings of blood chemistry: Secondary | ICD-10-CM

## 2021-03-07 DIAGNOSIS — D696 Thrombocytopenia, unspecified: Secondary | ICD-10-CM

## 2021-03-07 DIAGNOSIS — R7401 Elevation of levels of liver transaminase levels: Secondary | ICD-10-CM

## 2021-03-07 DIAGNOSIS — D61818 Other pancytopenia: Secondary | ICD-10-CM

## 2021-03-07 LAB — CBC WITH DIFFERENTIAL/PLATELET
Abs Immature Granulocytes: 0.01 10*3/uL (ref 0.00–0.07)
Basophils Absolute: 0 10*3/uL (ref 0.0–0.1)
Basophils Relative: 0 %
Eosinophils Absolute: 0 10*3/uL (ref 0.0–0.5)
Eosinophils Relative: 0 %
HCT: 28.7 % — ABNORMAL LOW (ref 36.0–46.0)
Hemoglobin: 8.9 g/dL — ABNORMAL LOW (ref 12.0–15.0)
Immature Granulocytes: 0 %
Lymphocytes Relative: 33 %
Lymphs Abs: 0.8 10*3/uL (ref 0.7–4.0)
MCH: 25.1 pg — ABNORMAL LOW (ref 26.0–34.0)
MCHC: 31 g/dL (ref 30.0–36.0)
MCV: 80.8 fL (ref 80.0–100.0)
Monocytes Absolute: 0.3 10*3/uL (ref 0.1–1.0)
Monocytes Relative: 15 %
Neutro Abs: 1.2 10*3/uL — ABNORMAL LOW (ref 1.7–7.7)
Neutrophils Relative %: 52 %
Platelets: 137 10*3/uL — ABNORMAL LOW (ref 150–400)
RBC: 3.55 MIL/uL — ABNORMAL LOW (ref 3.87–5.11)
RDW: 15.9 % — ABNORMAL HIGH (ref 11.5–15.5)
WBC: 2.3 10*3/uL — ABNORMAL LOW (ref 4.0–10.5)
nRBC: 0 % (ref 0.0–0.2)

## 2021-03-07 LAB — COMPREHENSIVE METABOLIC PANEL
ALT: 126 U/L — ABNORMAL HIGH (ref 0–44)
AST: 448 U/L — ABNORMAL HIGH (ref 15–41)
Albumin: 2.4 g/dL — ABNORMAL LOW (ref 3.5–5.0)
Alkaline Phosphatase: 49 U/L (ref 38–126)
Anion gap: 5 (ref 5–15)
BUN: 49 mg/dL — ABNORMAL HIGH (ref 6–20)
CO2: 21 mmol/L — ABNORMAL LOW (ref 22–32)
Calcium: 7.5 mg/dL — ABNORMAL LOW (ref 8.9–10.3)
Chloride: 111 mmol/L (ref 98–111)
Creatinine, Ser: 4.44 mg/dL — ABNORMAL HIGH (ref 0.44–1.00)
GFR, Estimated: 13 mL/min — ABNORMAL LOW (ref >60)
Glucose, Bld: 117 mg/dL — ABNORMAL HIGH (ref 70–99)
Potassium: 4.1 mmol/L (ref 3.5–5.1)
Sodium: 137 mmol/L (ref 135–145)
Total Bilirubin: 0.7 mg/dL (ref 0.3–1.2)
Total Protein: 5.6 g/dL — ABNORMAL LOW (ref 6.5–8.1)

## 2021-03-07 LAB — ANTI-DNA ANTIBODY, DOUBLE-STRANDED: ds DNA Ab: <1 IU/mL (ref 0–9)

## 2021-03-07 LAB — DRUG PROFILE, UR, 9 DRUGS (LABCORP)
Amphetamines, Urine: NEGATIVE ng/mL
Barbiturate, Ur: NEGATIVE ng/mL
Benzodiazepine Quant, Ur: NEGATIVE ng/mL
Cannabinoid Quant, Ur: NEGATIVE ng/mL
Cocaine (Metab.): NEGATIVE ng/mL
Methadone Screen, Urine: NEGATIVE ng/mL
Opiate Quant, Ur: NEGATIVE ng/mL
Phencyclidine, Ur: NEGATIVE ng/mL
Propoxyphene, Urine: NEGATIVE ng/mL

## 2021-03-07 LAB — MITOCHONDRIAL ANTIBODIES: Mitochondrial M2 Ab, IgG: <20 Units (ref 0.0–20.0)

## 2021-03-07 LAB — ANCA TITERS
Atypical P-ANCA titer: <1:20 {titer}
C-ANCA: <1:20 {titer}
P-ANCA: <1:20 {titer}

## 2021-03-07 LAB — ANTISTREPTOLYSIN O TITER: ASO: 227 IU/mL — ABNORMAL HIGH (ref 0.0–200.0)

## 2021-03-07 LAB — ANTI-SMOOTH MUSCLE ANTIBODY, IGG: F-Actin IgG: 5 Units (ref 0–19)

## 2021-03-07 LAB — C3 COMPLEMENT: C3 Complement: 119 mg/dL (ref 82–167)

## 2021-03-07 LAB — C4 COMPLEMENT: Complement C4, Body Fluid: 41 mg/dL — ABNORMAL HIGH (ref 12–38)

## 2021-03-07 MED ORDER — PANTOPRAZOLE SODIUM 40 MG PO TBEC
40.0000 mg | DELAYED_RELEASE_TABLET | Freq: Every day | ORAL | Status: DC
  Administered 2021-03-07 – 2021-03-09 (×3): 40 mg via ORAL
  Filled 2021-03-07 (×3): qty 1

## 2021-03-07 MED ORDER — CALCIUM CARBONATE ANTACID 500 MG PO CHEW
1.0000 | CHEWABLE_TABLET | Freq: Two times a day (BID) | ORAL | Status: DC
  Filled 2021-03-07 (×2): qty 1

## 2021-03-07 MED ORDER — NYSTATIN 100000 UNIT/ML MT SUSP
5.0000 mL | Freq: Four times a day (QID) | OROMUCOSAL | Status: DC
  Administered 2021-03-07: 500000 [IU] via ORAL
  Filled 2021-03-07 (×2): qty 5

## 2021-03-07 NOTE — Progress Notes (Signed)
PROGRESS NOTE    Joan Peterson  GYK:[id number] DOB: 1986/03/10 DOA: 03/04/2021 PCP: Rema Jasmine, NP Outpatient Specialists:   Brief Hospital Course Joan Peterson is a 35 yo female with medical history significant for HSV2 infection who was admitted 6/26 for syncope, hypostatic hypotension, elevated AST (in the 700s) and AKI. Workup evaluating for tickborne illnesses, nephrotic/nephritic syndromes, and autoimmune hepatitis were started with pending results. Infectious workup revealed reactive HIV screen 6/28; confirmatory test pending. Patient had risk factors including high-risk sexual practice (in a long-term relationship but recently had a new sexual contact) and past diagnosis of Chlamydia (2012). Nephrology and gastroenterology are following to manage elevated LFTs and worsening AKI.   Assessment & Plan:   Principal Problem:   AKI (acute kidney injury) (HCC) Active Problems:   Transaminitis   Syncope and collapse   Thrombocytopenia (HCC)   Elevated LFTs   Pancytopenia (HCC)   #AKI - in setting of hypotension, volume depletion, pancytopenia, and abnormal LFT's. Ddx includes ischemic ATN vs systemic vasculitis/acute GN vs HIV-associated nephropathy vs AIN vs or postinfectious GN (patient was treated for pharyngitis with abx 3 weeks ago) vs ANCA. BUN/Cr continues to stay elevated today (49/4.4). UOP still low at 200 ml/24 hr -Nephro consultation requested and appreciate the recommendations.  - ANA and ASO negative, normal complements - ANCA, anti-DNA, anti GBM pending  - UA shows large amount of blood and small quantity of protein (100), suggestive of nephritic pattern - Uchem: UPC 0.90 (>0.15)   - Daily CMP - Consider renal biopsy   #Elevated AST/ALT - in setting of hypotension, AKI, pancytopenia. Ddx includes autoimmune hepatitis vs secondary hepatic ischemia in setting of acute viral illness. AST/ALT trending down but still elevated (448/126) Given positive HIV screen (pending  confirmatory Antibody test), opportunistic infections are on the differential. - GI following; appreciate recs - F/u autoimmune hepatitis panel    #Pancytopenia - WBC , HgB , PLT  in setting of positive HIV screen. Etiologies of pancytopenia are broad and involves processes of bone marrow replacement (malignancy, myelofibrosis), BM failure (medications, autoimmune disorders -SLE, RA, sarcoid, or nutrition factors - B12, alcohol use), or destruction of cells due to cirrhosis, infections (EBV). Given acute onset, highly considering infectious etiology, especially in the setting of positive HIV screen. - F/u HIV Ab confirmatory test (spoke to lab, who said it should be back midnight 6/29) - Plan to call and discuss case with Hem/onc if HIV test returns positive - F/u CBC today   #Positive HIV screen - Patient had prodrome with severe fatigue for three weeks before admission and new sexual partner a few months prior.  - F/u HIV-1/2 Ab  - Order RPR, GC/CT  - Plan to consult with ID pending results of confirmatory testing  DVT prophylaxis: Patient ambulating Code Status: Full Family Communication: plan of care discussed in detail with patient and she verbalized understanding Disposition Plan: anticipate home when medically stabilized   Consultants:  Colodonato (Nephro) Carlan (GI)  Procedures:  N/a  Antimicrobials:  N/a   Subjective: Patient seen at bedside talking to family. States that nausea has improved with compazine. Eating breakfast well.  Urinated three times in 24 hours. Otherwise, no acute complaints. Discussed with patient regarding likely HIV diagnosis. States that she did have a three-week episode of severe fatigue and that she "slept all day."   Objective: Vitals:   03/06/21 0547 03/06/21 1300 03/06/21 2128 03/07/21 0354  BP: 108/76 (!) 109/59 122/64 124/65  Pulse: 76 71 74 71  Resp: 18 20 20 20   Temp: 97.7 F (36.5 C) (!) 97.5 F (36.4 C) 98.4 F (36.9 C) 98.3 F  (36.8 C)  TempSrc: Oral  Oral Oral  SpO2: 100%  100% 100%  Weight:      Height:        Intake/Output Summary (Last 24 hours) at 03/07/2021 1016 Last data filed at 03/06/2021 2000 Gross per 24 hour  Intake 360 ml  Output 200 ml  Net 160 ml   Filed Weights   03/04/21 1610  Weight: 117.9 kg    Examination:  General exam: Appears calm and comfortable  Respiratory system: Clear to auscultation. Respiratory effort normal. Cardiovascular system: S1 & S2 heard, RRR. No JVD, murmurs, rubs, gallops or clicks. +1 edema at ankles bilaterally. No angioedema present Gastrointestinal system: Abdomen is nondistended, soft and nontender. No organomegaly or masses felt. Normal bowel sounds heard. Central nervous system: Alert and oriented. No focal neurological deficits. Extremities: Symmetric 5 x 5 power. Skin: No rashes, lesions or ulcers. Bug bites healing well with scabbing.  Psychiatry: Judgement and insight appear normal. Mood & affect appropriate.     Data Reviewed: I have personally reviewed following labs and imaging studies  CBC: Recent Labs  Lab 03/04/21 1701 03/05/21 0518 03/06/21 0358  WBC 2.9* 2.1* 2.5*  NEUTROABS 1.9  --  1.2*  HGB 11.9* 10.6* 9.4*  HCT 37.9 34.7* 30.9*  MCV 77.5* 78.9* 81.1  PLT 98* 90* 93*   Basic Metabolic Panel: Recent Labs  Lab 03/04/21 1701 03/05/21 0518 03/06/21 0358 03/07/21 0536  NA 130* 133* 136 137  K 3.8 3.7 3.9 4.1  CL 102 103 107 111  CO2 19* 21* 22 21*  GLUCOSE 144* 119* 100* 117*  BUN 33* 40* 50* 49*  CREATININE 3.04* 3.53* 4.25* 4.44*  CALCIUM 7.4* 7.3* 7.2* 7.5*  MG  --   --  2.5*  --    GFR: Estimated Creatinine Clearance: 21.8 mL/min (A) (by C-G formula based on SCr of 4.44 mg/dL (H)). Liver Function Tests: Recent Labs  Lab 03/04/21 1701 03/05/21 0518 03/06/21 0358 03/07/21 0536  AST 779* 678* 515* 448*  ALT 129* 116* 115* 126*  ALKPHOS 58 53 49 49  BILITOT 0.8 0.9 0.9 0.7  PROT 6.5 5.9* 5.6* 5.6*  ALBUMIN  2.8* 2.5* 2.4* 2.4*   No results for input(s): LIPASE, AMYLASE in the last 168 hours. Recent Labs  Lab 03/04/21 2019  AMMONIA 25   Coagulation Profile: Recent Labs  Lab 03/04/21 2018 03/05/21 0518 03/06/21 1230  INR 1.0 1.0 1.1   Cardiac Enzymes: No results for input(s): CKTOTAL, CKMB, CKMBINDEX, TROPONINI in the last 168 hours. BNP (last 3 results) No results for input(s): PROBNP in the last 8760 hours. HbA1C: No results for input(s): HGBA1C in the last 72 hours. CBG: No results for input(s): GLUCAP in the last 168 hours. Lipid Profile: No results for input(s): CHOL, HDL, LDLCALC, TRIG, CHOLHDL, LDLDIRECT in the last 72 hours. Thyroid Function Tests: No results for input(s): TSH, T4TOTAL, FREET4, T3FREE, THYROIDAB in the last 72 hours. Anemia Panel: Recent Labs    03/04/21 1701 03/05/21 1030  VITAMINB12 365  --   FOLATE 15.4  --   FERRITIN 2,051*  --   TIBC 314  --   IRON 25*  --   RETICCTPCT  --  <0.4*   Urine analysis:    Component Value Date/Time   COLORURINE YELLOW 03/06/2021 0829   APPEARANCEUR CLEAR 03/06/2021 0829   LABSPEC 1.010  03/06/2021 0829   PHURINE 6.0 03/06/2021 0829   GLUCOSEU NEGATIVE 03/06/2021 0829   HGBUR LARGE (A) 03/06/2021 0829   BILIRUBINUR NEGATIVE 03/06/2021 0829   KETONESUR NEGATIVE 03/06/2021 0829   PROTEINUR 100 (A) 03/06/2021 0829   UROBILINOGEN 0.2 08/10/2011 1035   NITRITE NEGATIVE 03/06/2021 0829   LEUKOCYTESUR TRACE (A) 03/06/2021 0829   Sepsis Labs: @LABRCNTIP (procalcitonin:4,lacticidven:4)  ) Recent Results (from the past 240 hour(s))  Resp Panel by RT-PCR (Flu A&B, Covid) Nasopharyngeal Swab     Status: None   Collection Time: 02/26/21  5:13 PM   Specimen: Nasopharyngeal Swab; Nasopharyngeal(NP) swabs in vial transport medium  Result Value Ref Range Status   SARS Coronavirus 2 by RT PCR NEGATIVE NEGATIVE Final    Comment: (NOTE) SARS-CoV-2 target nucleic acids are NOT DETECTED.  The SARS-CoV-2 RNA is generally  detectable in upper respiratory specimens during the acute phase of infection. The lowest concentration of SARS-CoV-2 viral copies this assay can detect is 138 copies/mL. A negative result does not preclude SARS-Cov-2 infection and should not be used as the sole basis for treatment or other patient management decisions. A negative result may occur with  improper specimen collection/handling, submission of specimen other than nasopharyngeal swab, presence of viral mutation(s) within the areas targeted by this assay, and inadequate number of viral copies(<138 copies/mL). A negative result must be combined with clinical observations, patient history, and epidemiological information. The expected result is Negative.  Fact Sheet for Patients:  02/28/21  Fact Sheet for Healthcare Providers:  BloggerCourse.com  This test is no t yet approved or cleared by the [place] FDA and  has been authorized for detection and/or diagnosis of SARS-CoV-2 by FDA under an Emergency Use Authorization (EUA). This EUA will remain  in effect (meaning this test can be used) for the duration of the COVID-19 declaration under Section 564(b)(1) of the Act, 21 U.S.C.section 360bbb-3(b)(1), unless the authorization is terminated  or revoked sooner.       Influenza A by PCR NEGATIVE NEGATIVE Final   Influenza B by PCR NEGATIVE NEGATIVE Final    Comment: (NOTE) The Xpert Xpress SARS-CoV-2/FLU/RSV plus assay is intended as an aid in the diagnosis of influenza from Nasopharyngeal swab specimens and should not be used as a sole basis for treatment. Nasal washings and aspirates are unacceptable for Xpert Xpress SARS-CoV-2/FLU/RSV testing.  Fact Sheet for Patients: [place]  Fact Sheet for Healthcare Providers: BloggerCourse.com  This test is not yet approved or cleared by the [employer]ious[employer]ker.[place] FDA  and has been authorized for detection and/or diagnosis of SARS-CoV-2 by FDA under an Emergency Use Authorization (EUA). This EUA will remain in effect (meaning this test can be used) for the duration of the COVID-19 declaration under Section 564(b)(1) of the Act, 21 U.S.C. section 360bbb-3(b)(1), unless the authorization is terminated or revoked.  Performed at [facility], [address], [place], [place]   Resp Panel by RT-PCR (Flu A&B, Covid) Nasopharyngeal Swab     Status: None   Collection Time: 03/04/21  6:11 PM   Specimen: Nasopharyngeal Swab; Nasopharyngeal(NP) swabs in vial transport medium  Result Value Ref Range Status   SARS Coronavirus 2 by RT PCR NEGATIVE NEGATIVE Final    Comment: (NOTE) SARS-CoV-2 target nucleic acids are NOT DETECTED.  The SARS-CoV-2 RNA is generally detectable in upper respiratory specimens during the acute phase of infection. The lowest concentration of SARS-CoV-2 viral copies this assay can detect is 138 copies/mL. A negative result does not preclude SARS-Cov-2  infection and should not be used as the sole basis for treatment or other patient management decisions. A negative result may occur with  improper specimen collection/handling, submission of specimen other than nasopharyngeal swab, presence of viral mutation(s) within the areas targeted by this assay, and inadequate number of viral copies(<138 copies/mL). A negative result must be combined with clinical observations, patient history, and epidemiological information. The expected result is Negative.  Fact Sheet for Patients:  EntrepreneurPulse.com.au  Fact Sheet for Healthcare Providers:  IncredibleEmployment.be  This test is no t yet approved or cleared by the [place] FDA and  has been authorized for detection and/or diagnosis of SARS-CoV-2 by FDA under an Emergency Use Authorization (EUA). This EUA will remain  in effect (meaning  this test can be used) for the duration of the COVID-19 declaration under Section 564(b)(1) of the Act, 21 U.S.C.section 360bbb-3(b)(1), unless the authorization is terminated  or revoked sooner.       Influenza A by PCR NEGATIVE NEGATIVE Final   Influenza B by PCR NEGATIVE NEGATIVE Final    Comment: (NOTE) The Xpert Xpress SARS-CoV-2/FLU/RSV plus assay is intended as an aid in the diagnosis of influenza from Nasopharyngeal swab specimens and should not be used as a sole basis for treatment. Nasal washings and aspirates are unacceptable for Xpert Xpress SARS-CoV-2/FLU/RSV testing.  Fact Sheet for Patients: EntrepreneurPulse.com.au  Fact Sheet for Healthcare Providers: IncredibleEmployment.be  This test is not yet approved or cleared by the [place] FDA and has been authorized for detection and/or diagnosis of SARS-CoV-2 by FDA under an Emergency Use Authorization (EUA). This EUA will remain in effect (meaning this test can be used) for the duration of the COVID-19 declaration under Section 564(b)(1) of the Act, 21 U.S.C. section 360bbb-3(b)(1), unless the authorization is terminated or revoked.  Performed at [facility], [address], [place], [place] [address]   Culture, group A strep     Status: None (Preliminary result)   Collection Time: 03/05/21 12:00 PM   Specimen: Throat  Result Value Ref Range Status   Specimen Description   Final    THROAT Performed at [facility], [address] [place], [place] [address]    Special Requests   Final    NONE Performed at [facility], [address] [place], [place] [address]    Culture   Final    CULTURE REINCUBATED FOR BETTER GROWTH Performed at [facility], [place] [address] [place],  [address]    Report Status PENDING  Incomplete         Radiology Studies: US Abdomen Limited RUQ (LIVER/GB)  Result Date: 03/05/2021 CLINICAL DATA:  Transaminitis EXAM:  ULTRASOUND ABDOMEN LIMITED RIGHT UPPER QUADRANT COMPARISON:  CT abdomen pelvis 02/28/2021 FINDINGS: Gallbladder: No gallstones or wall thickening visualized. No sonographic Murphy sign noted by sonographer. Common bile duct: Diameter: 4 mm Liver: No focal lesion identified. Within normal limits in parenchymal echogenicity. Portal vein is patent on color Doppler imaging with normal direction of blood flow towards the liver. Other: None. IMPRESSION: No significant sonographic abnormality of the liver or gallbladder Electronically Signed   By: Miachel Roux M.D.   On: 03/05/2021 15:25        Scheduled Meds:  feeding supplement  1 Container Oral TID BM   Continuous Infusions:  sodium chloride 100 mL/hr at 03/06/21 0121   Addendum:  Patient overall feeling well and is improved clinically with improving urine output noted.  Continue to trend labs with LFTs as  well as creatinine and monitor for signs of improvement.  HIV confirmatory test currently pending.  Consideration for renal biopsy should she not have steady improvement.  CMV and EBV studies pending per GI.  Tick bite panel pending.   LOS: 3 days    Time spent: 30 minutes    Fredrik Rigger, MS4  If 7PM-7AM, please contact night-coverage [email or web address] Password [facility] 03/07/2021, 10:16 AM

## 2021-03-07 NOTE — Progress Notes (Signed)
Subjective: Patient doing well this morning, report no other episode of diarrhea after yesterday morning. State that her cough and sore throat have improved. Denies n/v or abdominal pain. Was able to eat some breakfast. She also reports she is starting to make more urine over the past 24 hours.   Objective: Vital signs in last 24 hours: Temp:  [97.5 F (36.4 C)-98.4 F (36.9 C)] 98.3 F (36.8 C) (06/29 0354) Pulse Rate:  [71-74] 71 (06/29 0354) Resp:  [20] 20 (06/29 0354) BP: (109-124)/(59-65) 124/65 (06/29 0354) SpO2:  [100 %] 100 % (06/29 0354) Last BM Date: 03/04/21 General:   Alert and oriented, pleasant Head:  Normocephalic and atraumatic. Heart:  S1, S2 present, no murmurs noted.  Lungs: Clear to auscultation bilaterally, without wheezing, rales, or rhonchi.  Abdomen:  Bowel sounds present, soft, non-tender, non-distended. No HSM or hernias noted. No rebound or guarding. No masses appreciated  Msk:  Symmetrical without gross deformities. Normal posture. Pulses:  Normal pulses noted. Extremities:  Without edema. Neurologic:  Alert and  oriented x4;  grossly normal neurologically. Skin:  Warm and dry, intact without significant lesions.  Psych:  Alert and cooperative. Normal mood and affect.  Intake/Output from previous day: 06/28 0701 - 06/29 0700 In: 360 [P.O.:360] Out: 200 [Urine:200] Intake/Output this shift: No intake/output data recorded.  Lab Results: Recent Labs    03/04/21 1701 03/05/21 0518 03/06/21 0358  WBC 2.9* 2.1* 2.5*  HGB 11.9* 10.6* 9.4*  HCT 37.9 34.7* 30.9*  PLT 98* 90* 93*   BMET Recent Labs    03/05/21 0518 03/06/21 0358 03/07/21 0536  NA 133* 136 137  K 3.7 3.9 4.1  CL 103 107 111  CO2 21* 22 21*  GLUCOSE 119* 100* 117*  BUN 40* 50* 49*  CREATININE 3.53* 4.25* 4.44*  CALCIUM 7.3* 7.2* 7.5*   LFT Recent Labs    03/05/21 0518 03/06/21 0358 03/07/21 0536  PROT 5.9* 5.6* 5.6*  ALBUMIN 2.5* 2.4* 2.4*  AST 678* 515* 448*  ALT  116* 115* 126*  ALKPHOS 53 49 49  BILITOT 0.9 0.9 0.7   PT/INR Recent Labs    03/05/21 0518 03/06/21 1230  LABPROT 13.2 13.7  INR 1.0 1.1   Hepatitis Panel Recent Labs    03/04/21 2018  HEPBSAG NON REACTIVE  HCVAB NON REACTIVE  HEPAIGM NON REACTIVE  HEPBIGM NON REACTIVE    Studies/Results: US Abdomen Limited RUQ (LIVER/GB)  Result Date: 03/05/2021 CLINICAL DATA:  Transaminitis EXAM: ULTRASOUND ABDOMEN LIMITED RIGHT UPPER QUADRANT COMPARISON:  CT abdomen pelvis 02/28/2021 FINDINGS: Gallbladder: No gallstones or wall thickening visualized. No sonographic Murphy sign noted by sonographer. Common bile duct: Diameter: 4 mm Liver: No focal lesion identified. Within normal limits in parenchymal echogenicity. Portal vein is patent on color Doppler imaging with normal direction of blood flow towards the liver. Other: None. IMPRESSION: No significant sonographic abnormality of the liver or gallbladder Electronically Signed   By: Farhaan  Mir M.D.   On: 03/05/2021 15:25    Assessment: Joan Peterson is a 35 year old female with medical history significant for HSV2 infection, admitted on 6/26 for syncope, hypotension, transaminitis and AKI. She continues to be followed by our service for transaminitis  LFTs remain stable, ALT 126 this morning, up slightly from 115 yesterday, however, AST 448, continuing to trend down from 515 yesterday. Alk Phos and total bili remain WNL. INR remains normal at 1.1 yesterday.  Autoimmune hepatitis and acute hep panels unremarkable. Positive initial HIV testing, however confirmatory   results are pending.   Extensive serologies, including HIV, RMSF, Erlichia, B. Burgdorfid, EBV and CMV still pending.  Differentials include secondary hepatic ischemia in setting of acute viral illness, cannot exclude other autoimmune diseases such as vasculitis or collagen autoimmune diseases.    Plan: Continue to trend LFTs Consider stool studies if persistent diarrhea of 3  or more episodes per day Will continue to follow for results of CMV and EBV    LOS: 3 days    03/07/2021, 8:40 AM  Joan Peterson L. Alver Sorrow, MSN, APRN, AGNP-C Adult-Gerontology Nurse Practitioner [facility]

## 2021-03-07 NOTE — Progress Notes (Signed)
Patient ID: Joan Peterson, female   DOB: March 11, 1986, 35 y.o.   MRN: [id number] S: Starting to feel better and is having more urine output. O:BP 124/65 (BP Location: Left Arm)   Pulse 71   Temp 98.3 F (36.8 C) (Oral)   Resp 20   Ht 5' 2"  (1.575 m)   Wt 117.9 kg   LMP 02/27/2021   SpO2 100%   BMI 47.55 kg/m   Intake/Output Summary (Last 24 hours) at 03/07/2021 1000 Last data filed at 03/06/2021 2000 Gross per 24 hour  Intake 360 ml  Output 200 ml  Net 160 ml   Intake/Output: I/O last 3 completed shifts: In: 1358.2 [P.O.:480; I.V.:878.2] Out: 200 [Urine:200]  Intake/Output this shift:  No intake/output data recorded. Weight change:  Gen:NAD CVS:RRR Resp: CTA Abd: +BS, soft, NT/ND Ext: no edema  Recent Labs  Lab 03/04/21 1701 03/05/21 0518 03/06/21 0358 03/07/21 0536  NA 130* 133* 136 137  K 3.8 3.7 3.9 4.1  CL 102 103 107 111  CO2 19* 21* 22 21*  GLUCOSE 144* 119* 100* 117*  BUN 33* 40* 50* 49*  CREATININE 3.04* 3.53* 4.25* 4.44*  ALBUMIN 2.8* 2.5* 2.4* 2.4*  CALCIUM 7.4* 7.3* 7.2* 7.5*  AST 779* 678* 515* 448*  ALT 129* 116* 115* 126*   Liver Function Tests: Recent Labs  Lab 03/05/21 0518 03/06/21 0358 03/07/21 0536  AST 678* 515* 448*  ALT 116* 115* 126*  ALKPHOS 53 49 49  BILITOT 0.9 0.9 0.7  PROT 5.9* 5.6* 5.6*  ALBUMIN 2.5* 2.4* 2.4*   No results for input(s): LIPASE, AMYLASE in the last 168 hours. Recent Labs  Lab 03/04/21 2019  AMMONIA 25   CBC: Recent Labs  Lab 03/04/21 1701 03/05/21 0518 03/06/21 0358  WBC 2.9* 2.1* 2.5*  NEUTROABS 1.9  --  1.2*  HGB 11.9* 10.6* 9.4*  HCT 37.9 34.7* 30.9*  MCV 77.5* 78.9* 81.1  PLT 98* 90* 93*   Cardiac Enzymes: No results for input(s): CKTOTAL, CKMB, CKMBINDEX, TROPONINI in the last 168 hours. CBG: No results for input(s): GLUCAP in the last 168 hours.  Iron Studies:  Recent Labs    03/04/21 1701  IRON 25*  TIBC 314  FERRITIN 2,051*   Studies/Results: US Abdomen Limited RUQ  (LIVER/GB)  Result Date: 03/05/2021 CLINICAL DATA:  Transaminitis EXAM: ULTRASOUND ABDOMEN LIMITED RIGHT UPPER QUADRANT COMPARISON:  CT abdomen pelvis 02/28/2021 FINDINGS: Gallbladder: No gallstones or wall thickening visualized. No sonographic Murphy sign noted by sonographer. Common bile duct: Diameter: 4 mm Liver: No focal lesion identified. Within normal limits in parenchymal echogenicity. Portal vein is patent on color Doppler imaging with normal direction of blood flow towards the liver. Other: None. IMPRESSION: No significant sonographic abnormality of the liver or gallbladder Electronically Signed   By: Miachel Roux M.D.   On: 03/05/2021 15:25    feeding supplement  1 Container Oral TID BM    BMET    Component Value Date/Time   NA 137 03/07/2021 0536   K 4.1 03/07/2021 0536   CL 111 03/07/2021 0536   CO2 21 (L) 03/07/2021 0536   GLUCOSE 117 (H) 03/07/2021 0536   BUN 49 (H) 03/07/2021 0536   CREATININE 4.44 (H) 03/07/2021 0536   CALCIUM 7.5 (L) 03/07/2021 0536   GFRNONAA 13 (L) 03/07/2021 0536   CBC    Component Value Date/Time   WBC 2.5 (L) 03/06/2021 0358   RBC 3.81 (L) 03/06/2021 0358   HGB 9.4 (L) 03/06/2021 3716  HCT 30.9 (L) 03/06/2021 0358   PLT 93 (L) 03/06/2021 0358   MCV 81.1 03/06/2021 0358   MCH 24.7 (L) 03/06/2021 0358   MCHC 30.4 03/06/2021 0358   RDW 15.7 (H) 03/06/2021 0358   LYMPHSABS 0.9 03/06/2021 0358   MONOABS 0.3 03/06/2021 0358   EOSABS 0.0 03/06/2021 0358   BASOSABS 0.0 03/06/2021 0358    Assessment/Plan:  AKI - in setting of hypotension, volume depletion, pancytopenia, and abnormal LFT's.  Possible ischemic ATN but need to rule out systemic vasculitis/acute GN.  She had been treated with abx for pharyngitis 3 weeks ago so AIN or post-infectious GN on DDx as well as ANCA.  Starting to make urine and will need to send studies.  CT of abdomen done last week was unrevealing.  Tickborne diseases panel pending. ANA and ASO negative, normal  complements. ANCA, anti-DNA, anti GBM pending. Hopefully starting to see some renal recovery as rate of rise of Scr has slowed and UOP has picked up (although not documented). Discussed possible role of renal biopsy pending serologies. No indication for dialysis at this time.  Abnormal LFT's- workup underway and GI consulted. Pancytopenia - Lyme disease panel negative, RMSF and Ehrlichia pending.  Recommend Heme consultation and possible bone marrow biopsy.  Serologies and SPEP/UPEP pending.  HIV confirmatory test pending.  Orthostatic hypotension - receiving IVF's. Pustule on left thigh - has been treated with abx per primary svc. HIV + antibody test.  Will need to be confirmed, however acute HIV infection can present with URI symptoms.  She denies any unprotected sex except for her boyfriend of 14 years.  ID consult if confirmatory test positive.   Donetta Potts, MD [employer] [phone]

## 2021-03-08 DIAGNOSIS — N179 Acute kidney failure, unspecified: Secondary | ICD-10-CM

## 2021-03-08 LAB — IMMUNOFIXATION ELECTROPHORESIS
IgA: 208 mg/dL (ref 87–352)
IgG (Immunoglobin G), Serum: 1272 mg/dL (ref 586–1602)
IgM (Immunoglobulin M), Srm: 56 mg/dL (ref 26–217)
Total Protein ELP: 5.5 g/dL — ABNORMAL LOW (ref 6.0–8.5)

## 2021-03-08 LAB — HIV-1/HIV-2 QUALITATIVE RNA
HIV-1 RNA, Qualitative: REACTIVE — AB
HIV-2 RNA, Qualitative: NONREACTIVE

## 2021-03-08 LAB — COMPREHENSIVE METABOLIC PANEL
ALT: 132 U/L — ABNORMAL HIGH (ref 0–44)
AST: 391 U/L — ABNORMAL HIGH (ref 15–41)
Albumin: 2.5 g/dL — ABNORMAL LOW (ref 3.5–5.0)
Alkaline Phosphatase: 46 U/L (ref 38–126)
Anion gap: 6 (ref 5–15)
BUN: 42 mg/dL — ABNORMAL HIGH (ref 6–20)
CO2: 20 mmol/L — ABNORMAL LOW (ref 22–32)
Calcium: 7.6 mg/dL — ABNORMAL LOW (ref 8.9–10.3)
Chloride: 111 mmol/L (ref 98–111)
Creatinine, Ser: 4.35 mg/dL — ABNORMAL HIGH (ref 0.44–1.00)
GFR, Estimated: 13 mL/min — ABNORMAL LOW (ref >60)
Glucose, Bld: 109 mg/dL — ABNORMAL HIGH (ref 70–99)
Potassium: 4.3 mmol/L (ref 3.5–5.1)
Sodium: 137 mmol/L (ref 135–145)
Total Bilirubin: 0.2 mg/dL — ABNORMAL LOW (ref 0.3–1.2)
Total Protein: 5.8 g/dL — ABNORMAL LOW (ref 6.5–8.1)

## 2021-03-08 LAB — HIV-1/2 AB - DIFFERENTIATION
HIV 1 Ab: NONREACTIVE
HIV 2 Ab: NONREACTIVE
Note: NEGATIVE

## 2021-03-08 LAB — ROCKY MTN SPOTTED FVR ABS PNL(IGG+IGM)
RMSF IgG: NEGATIVE
RMSF IgM: 0.38 index (ref 0.00–0.89)

## 2021-03-08 LAB — CBC WITH DIFFERENTIAL/PLATELET
Abs Immature Granulocytes: 0.03 10*3/uL (ref 0.00–0.07)
Basophils Absolute: 0 10*3/uL (ref 0.0–0.1)
Basophils Relative: 0 %
Eosinophils Absolute: 0 10*3/uL (ref 0.0–0.5)
Eosinophils Relative: 0 %
HCT: 28.3 % — ABNORMAL LOW (ref 36.0–46.0)
Hemoglobin: 8.5 g/dL — ABNORMAL LOW (ref 12.0–15.0)
Immature Granulocytes: 1 %
Lymphocytes Relative: 24 %
Lymphs Abs: 0.6 10*3/uL — ABNORMAL LOW (ref 0.7–4.0)
MCH: 24.6 pg — ABNORMAL LOW (ref 26.0–34.0)
MCHC: 30 g/dL (ref 30.0–36.0)
MCV: 82 fL (ref 80.0–100.0)
Monocytes Absolute: 0.3 10*3/uL (ref 0.1–1.0)
Monocytes Relative: 13 %
Neutro Abs: 1.6 10*3/uL — ABNORMAL LOW (ref 1.7–7.7)
Neutrophils Relative %: 62 %
Platelets: 165 10*3/uL (ref 150–400)
RBC: 3.45 MIL/uL — ABNORMAL LOW (ref 3.87–5.11)
RDW: 15.9 % — ABNORMAL HIGH (ref 11.5–15.5)
WBC: 2.7 10*3/uL — ABNORMAL LOW (ref 4.0–10.5)
nRBC: 0 % (ref 0.0–0.2)

## 2021-03-08 LAB — CULTURE, GROUP A STREP (THRC)

## 2021-03-08 LAB — CMV IGM: CMV IgM: <30 AU/mL (ref 0.0–29.9)

## 2021-03-08 LAB — GC/CHLAMYDIA PROBE AMP (~~LOC~~) NOT AT ARMC
Chlamydia: NEGATIVE
Comment: NEGATIVE
Comment: NORMAL
Neisseria Gonorrhea: NEGATIVE

## 2021-03-08 LAB — RPR: RPR Ser Ql: NONREACTIVE

## 2021-03-08 LAB — COMPLEMENT, TOTAL: Compl, Total (CH50): >60 U/mL (ref >41)

## 2021-03-08 NOTE — Progress Notes (Signed)
I received a call from Dr. Ninetta Lights regarding consulting pharmacy so that patient could be started on Dovato.  [facility] pharmacy was called, medication is yet to be placed on formulary, however pharmacist working with infectious disease appears to be working  on how patient will be able to obtain the medication per pharmacist at [facility].

## 2021-03-08 NOTE — Progress Notes (Signed)
PROGRESS NOTE    Joan Peterson  HMC:[id number] DOB: 1986-04-15 DOA: 03/04/2021 PCP: Joan Jasmine, NP Outpatient Specialists:    Brief Hospital Course [name] is a 35 yo female with medical history significant for HSV2 infection who was admitted 6/26 for syncope, hypostatic hypotension, elevated AST (in the 700s) and AKI. Workup evaluating for tickborne illnesses, nephrotic/nephritic syndromes, and autoimmune hepatitis were started with pending results. Infectious workup revealed reactive HIV screen 6/28; confirmatory test pending. Patient had risk factors including high-risk sexual practice (in a long-term relationship but recently had a new sexual contact) and past diagnosis of Chlamydia (2012). Most recent GC/CT 6/21 negative. Nephrology and gastroenterology are following to manage elevated LFTs and worsening AKI. Today, patient is physically and hemodynamically stable. LFTs continue to trend down (AST 391 from 700s) and BUN/cr stabilizes, trend down slightly.  Assessment & Plan:   Principal Problem:   AKI (acute kidney injury) (HCC) Active Problems:   Transaminitis   Syncope and collapse   Thrombocytopenia (HCC)   Elevated LFTs   Pancytopenia (HCC)  #AKI - Bun/Cr slightly improved from yesterday (42/4.3). Ddx includes ischemic ATN vs systemic vasculitis/acute GN vs HIV-associated nephropathy vs AIN vs or postinfectious GN (patient was treated for pharyngitis with abx 3 weeks ago).  UOP improving at 270 ml/24 hr -Nephro consultation requested and appreciate the recommendations.  - ANA and ASO negative, normal complements - ANCA, anti-DNA, anti GBM negative - Uchem: UPC 0.90 (>0.15)   - UA shows large amount of blood and small quantity of protein (100), suggestive of nephritic pattern - Daily CMP   #Elevated AST/ALT - in setting of hypotension, AKI, pancytopenia. Ddx includes autoimmune hepatitis vs secondary hepatic ischemia in setting of acute viral illness. AST/ALT  trending down but still elevated (448/126) Given positive HIV screen (pending confirmatory Antibody test), opportunistic infections are on the differential. - GI following; appreciate recs - F/u autoimmune hepatitis panel     #Pancytopenia - WBC , HgB , PLT  in setting of positive HIV screen. Etiologies of pancytopenia are broad and involves processes of bone marrow replacement (malignancy, myelofibrosis), BM failure (medications, autoimmune disorders -SLE, RA, sarcoid, or nutrition factors - B12, alcohol use), or destruction of cells due to cirrhosis, infections (EBV). Given acute onset, highly considering infectious etiology, especially in the setting of positive HIV screen. - Pancytopenia improved, though WBC and RBC still low; platelet count normalize - 165 - ANA and Anti-ds DNA negative - F/u HIV Ab confirmatory test  - Plan to call and discuss case with Infectious Disease if HIV test returns positive - Plan to discuss case with hem/onc if pancytopenia continues to worsen, though it appears to have been related to HIV  #Positive HIV screen - Patient had prodrome with severe fatigue for three weeks before admission and new sexual partner a few months prior. - F/u HIV-1/2 Ab - RPR, GC/CT negative - Plan to consult with ID pending results of confirmatory testing   DVT prophylaxis: Patient ambulating Code Status: Full Family Communication: plan of care discussed in detail with patient and she verbalized understanding Disposition Plan: anticipate home when medically stabilized     Consultants:  Colodonato (Nephro) Carlan (GI)   Procedures:  N/a   Antimicrobials:  N/a  Subjective: Sleeping upon evaluation. Informed of negative autoiumme workup for AKI and improved LFTs and BUN/Cr. Anxious about HIV test. No acute complaints  Objective: Vitals:   03/07/21 1405 03/07/21 1933 03/07/21 2036 03/08/21 0245  BP: 113/67 133/62 125/67 114/63  Pulse: 68 72 73 96  Resp: 18 20 20 20    Temp: 98 F (36.7 C) 97.6 F (36.4 C) 98 F (36.7 C) 98.3 F (36.8 C)  TempSrc: Oral  Oral Oral  SpO2: 100% 100% 100% 97%  Weight:      Height:        Intake/Output Summary (Last 24 hours) at 03/08/2021 1023 Last data filed at 03/08/2021 0542 Gross per 24 hour  Intake 480 ml  Output 750 ml  Net -270 ml   Filed Weights   03/04/21 1610  Weight: 117.9 kg    Examination:  General exam: Appears calm and comfortable  Respiratory system: Clear to auscultation. Respiratory effort normal. Cardiovascular system: S1 & S2 heard, RRR. No JVD, murmurs, rubs, gallops or clicks. No pedal edema. Gastrointestinal system: Abdomen is nondistended, soft and nontender. No organomegaly or masses felt. Normal bowel sounds heard. Central nervous system: Alert and oriented. No focal neurological deficits. Extremities: Symmetric 5 x 5 power. Skin: Small abscess on left thigh Psychiatry: Judgement and insight appear normal. Mood & affect down    Data Reviewed: I have personally reviewed following labs and imaging studies  CBC: Recent Labs  Lab 03/04/21 1701 03/05/21 0518 03/06/21 0358 03/07/21 1226 03/08/21 0438  WBC 2.9* 2.1* 2.5* 2.3* 2.7*  NEUTROABS 1.9  --  1.2* 1.2* 1.6*  HGB 11.9* 10.6* 9.4* 8.9* 8.5*  HCT 37.9 34.7* 30.9* 28.7* 28.3*  MCV 77.5* 78.9* 81.1 80.8 82.0  PLT 98* 90* 93* 137* 466   Basic Metabolic Panel: Recent Labs  Lab 03/04/21 1701 03/05/21 0518 03/06/21 0358 03/07/21 0536 03/08/21 0438  NA 130* 133* 136 137 137  K 3.8 3.7 3.9 4.1 4.3  CL 102 103 107 111 111  CO2 19* 21* 22 21* 20*  GLUCOSE 144* 119* 100* 117* 109*  BUN 33* 40* 50* 49* 42*  CREATININE 3.04* 3.53* 4.25* 4.44* 4.35*  CALCIUM 7.4* 7.3* 7.2* 7.5* 7.6*  MG  --   --  2.5*  --   --    GFR: Estimated Creatinine Clearance: 22.2 mL/min (A) (by C-G formula based on SCr of 4.35 mg/dL (H)). Liver Function Tests: Recent Labs  Lab 03/04/21 1701 03/05/21 0518 03/06/21 0358 03/07/21 0536  03/08/21 0438  AST 779* 678* 515* 448* 391*  ALT 129* 116* 115* 126* 132*  ALKPHOS 58 53 49 49 46  BILITOT 0.8 0.9 0.9 0.7 0.2*  PROT 6.5 5.9* 5.6* 5.6* 5.8*  ALBUMIN 2.8* 2.5* 2.4* 2.4* 2.5*   No results for input(s): LIPASE, AMYLASE in the last 168 hours. Recent Labs  Lab 03/04/21 2019  AMMONIA 25   Coagulation Profile: Recent Labs  Lab 03/04/21 2018 03/05/21 0518 03/06/21 1230  INR 1.0 1.0 1.1   Cardiac Enzymes: No results for input(s): CKTOTAL, CKMB, CKMBINDEX, TROPONINI in the last 168 hours. BNP (last 3 results) No results for input(s): PROBNP in the last 8760 hours. HbA1C: No results for input(s): HGBA1C in the last 72 hours. CBG: No results for input(s): GLUCAP in the last 168 hours. Lipid Profile: No results for input(s): CHOL, HDL, LDLCALC, TRIG, CHOLHDL, LDLDIRECT in the last 72 hours. Thyroid Function Tests: No results for input(s): TSH, T4TOTAL, FREET4, T3FREE, THYROIDAB in the last 72 hours. Anemia Panel: Recent Labs    03/05/21 1030  RETICCTPCT <0.4*   Urine analysis:    Component Value Date/Time   COLORURINE YELLOW 03/06/2021 0829   APPEARANCEUR CLEAR 03/06/2021 0829   LABSPEC 1.010 03/06/2021 0829   PHURINE 6.0  03/06/2021 0829   GLUCOSEU NEGATIVE 03/06/2021 0829   HGBUR LARGE (A) 03/06/2021 0829   BILIRUBINUR NEGATIVE 03/06/2021 0829   KETONESUR NEGATIVE 03/06/2021 0829   PROTEINUR 100 (A) 03/06/2021 0829   UROBILINOGEN 0.2 08/10/2011 1035   NITRITE NEGATIVE 03/06/2021 0829   LEUKOCYTESUR TRACE (A) 03/06/2021 0829   Sepsis Labs: @LABRCNTIP (procalcitonin:4,lacticidven:4)  ) Recent Results (from the past 240 hour(s))  Resp Panel by RT-PCR (Flu A&B, Covid) Nasopharyngeal Swab     Status: None   Collection Time: 02/26/21  5:13 PM   Specimen: Nasopharyngeal Swab; Nasopharyngeal(NP) swabs in vial transport medium  Result Value Ref Range Status   SARS Coronavirus 2 by RT PCR NEGATIVE NEGATIVE Final    Comment: (NOTE) SARS-CoV-2 target  nucleic acids are NOT DETECTED.  The SARS-CoV-2 RNA is generally detectable in upper respiratory specimens during the acute phase of infection. The lowest concentration of SARS-CoV-2 viral copies this assay can detect is 138 copies/mL. A negative result does not preclude SARS-Cov-2 infection and should not be used as the sole basis for treatment or other patient management decisions. A negative result may occur with  improper specimen collection/handling, submission of specimen other than nasopharyngeal swab, presence of viral mutation(s) within the areas targeted by this assay, and inadequate number of viral copies(<138 copies/mL). A negative result must be combined with clinical observations, patient history, and epidemiological information. The expected result is Negative.  Fact Sheet for Patients:  02/28/21  Fact Sheet for Healthcare Providers:  BloggerCourse.com  This test is no t yet approved or cleared by the [place] FDA and  has been authorized for detection and/or diagnosis of SARS-CoV-2 by FDA under an Emergency Use Authorization (EUA). This EUA will remain  in effect (meaning this test can be used) for the duration of the COVID-19 declaration under Section 564(b)(1) of the Act, 21 U.S.C.section 360bbb-3(b)(1), unless the authorization is terminated  or revoked sooner.       Influenza A by PCR NEGATIVE NEGATIVE Final   Influenza B by PCR NEGATIVE NEGATIVE Final    Comment: (NOTE) The Xpert Xpress SARS-CoV-2/FLU/RSV plus assay is intended as an aid in the diagnosis of influenza from Nasopharyngeal swab specimens and should not be used as a sole basis for treatment. Nasal washings and aspirates are unacceptable for Xpert Xpress SARS-CoV-2/FLU/RSV testing.  Fact Sheet for Patients: [place]  Fact Sheet for Healthcare  Providers: BloggerCourse.com  This test is not yet approved or cleared by the [place] FDA and has been authorized for detection and/or diagnosis of SARS-CoV-2 by FDA under an Emergency Use Authorization (EUA). This EUA will remain in effect (meaning this test can be used) for the duration of the COVID-19 declaration under Section 564(b)(1) of the Act, 21 U.S.C. section 360bbb-3(b)(1), unless the authorization is terminated or revoked.  Performed at [facility], [address] [place], [place]   Resp Panel by RT-PCR (Flu A&B, Covid) Nasopharyngeal Swab     Status: None   Collection Time: 03/04/21  6:11 PM   Specimen: Nasopharyngeal Swab; Nasopharyngeal(NP) swabs in vial transport medium  Result Value Ref Range Status   SARS Coronavirus 2 by RT PCR NEGATIVE NEGATIVE Final    Comment: (NOTE) SARS-CoV-2 target nucleic acids are NOT DETECTED.  The SARS-CoV-2 RNA is generally detectable in upper respiratory specimens during the acute phase of infection. The lowest concentration of SARS-CoV-2 viral copies this assay can detect is 138 copies/mL. A negative result does not preclude SARS-Cov-2 infection and should not be used  as the sole basis for treatment or other patient management decisions. A negative result may occur with  improper specimen collection/handling, submission of specimen other than nasopharyngeal swab, presence of viral mutation(s) within the areas targeted by this assay, and inadequate number of viral copies(<138 copies/mL). A negative result must be combined with clinical observations, patient history, and epidemiological information. The expected result is Negative.  Fact Sheet for Patients:  EntrepreneurPulse.com.au  Fact Sheet for Healthcare Providers:  IncredibleEmployment.be  This test is no t yet approved or cleared by the [place] FDA and  has been authorized for detection  and/or diagnosis of SARS-CoV-2 by FDA under an Emergency Use Authorization (EUA). This EUA will remain  in effect (meaning this test can be used) for the duration of the COVID-19 declaration under Section 564(b)(1) of the Act, 21 U.S.C.section 360bbb-3(b)(1), unless the authorization is terminated  or revoked sooner.       Influenza A by PCR NEGATIVE NEGATIVE Final   Influenza B by PCR NEGATIVE NEGATIVE Final    Comment: (NOTE) The Xpert Xpress SARS-CoV-2/FLU/RSV plus assay is intended as an aid in the diagnosis of influenza from Nasopharyngeal swab specimens and should not be used as a sole basis for treatment. Nasal washings and aspirates are unacceptable for Xpert Xpress SARS-CoV-2/FLU/RSV testing.  Fact Sheet for Patients: EntrepreneurPulse.com.au  Fact Sheet for Healthcare Providers: IncredibleEmployment.be  This test is not yet approved or cleared by the [place] FDA and has been authorized for detection and/or diagnosis of SARS-CoV-2 by FDA under an Emergency Use Authorization (EUA). This EUA will remain in effect (meaning this test can be used) for the duration of the COVID-19 declaration under Section 564(b)(1) of the Act, 21 U.S.C. section 360bbb-3(b)(1), unless the authorization is terminated or revoked.  Performed at [facility], [address] [place], [place] [address]   Culture, group A strep     Status: None (Preliminary result)   Collection Time: 03/05/21 12:00 PM   Specimen: Throat  Result Value Ref Range Status   Specimen Description   Final    THROAT Performed at [facility], [address], [place], [place] [address]    Special Requests   Final    NONE Performed at [facility], [address] [place], [place] [address]    Culture   Final    CULTURE REINCUBATED FOR BETTER GROWTH Performed at [facility], [place] [address], [place], [place] [address]    Report Status PENDING  Incomplete          Radiology Studies: No results found.      Scheduled Meds:  calcium carbonate  1 tablet Oral BID   feeding supplement  1 Container Oral TID BM   nystatin  5 mL Oral QID   pantoprazole  40 mg Oral Daily   Continuous Infusions:  sodium chloride 100 mL/hr at 03/08/21 0538     LOS: 4 days   Addendum:  Patient clinically continues to feel well and is awaiting further test results to include her HIV confirmatory test.  She is refusing to drink her nystatin which has been recommended by GI on account of oral thrush.  She continues to have good urine output with improvement in renal function noted and it does not appear that renal biopsy will be necessary.  Continue to monitor LFTs and anticipate discharge in the next 1-2 days if laboratory data continues to trend in the right direction.  Time spent: 30 minutes   Fredrik Rigger, Medical  student [facility] Pager [phone]  If 7PM-7AM, please contact night-coverage [email or web address] Password [facility] 03/08/2021, 10:23 AM

## 2021-03-08 NOTE — Progress Notes (Addendum)
Physical Therapy Treatment Patient Details Name: Joan Peterson MRN: [id number] DOB: [birth year] Today's Date: 03/08/2021    History of Present Illness Joan Peterson is a 35 y.o. female who presents with c/o passing out today. Patient reports over the past 3 weeks she has been feeling ill.  She reports vomiting, diarrhea, generalized weakness, sore throat, cough, fevers. PMH: HSV 2 infection.    PT Comments    Patient presents in bed alert and agreeable to therapy. Patient demonstrated modified independent bed mobility with a slight increase in time and was able to transfer to bed side and preform a sit to stand with supervision and min guard. Patient was able to ambulate for 60 feet without use of an AD with slow labored steps and the occasional use of the hand rail for balance. Patient required one standing rest break due to reports of lightheadedness. Patient was left in bed with nurse call. Patient will benefit from continued physical therapy in hospital and recommended venue below to increase strength, balance, endurance for safe ADLs and gait.    Follow Up Recommendations  Outpatient PT     Equipment Recommendations  None recommended by PT    Recommendations for Other Services       Precautions / Restrictions Precautions Precautions: None Restrictions Weight Bearing Restrictions: No    Mobility  Bed Mobility Overal bed mobility: Modified Independent             General bed mobility comments: increased time Patient Response: Cooperative  Transfers Overall transfer level: Modified independent Equipment used: None Transfers: Sit to/from Stand Sit to Stand: Min guard;Supervision Stand pivot transfers: Min guard;Supervision       General transfer comment: slow labored movement, unsteady on feet  Ambulation/Gait Ambulation/Gait assistance: Supervision Gait Distance (Feet): 60 Feet Assistive device: None Gait Pattern/deviations: Step-through  pattern;Decreased stride length Gait velocity: decreased   General Gait Details: pt ambulates in room, completing turns, navigating past furniture, no LOB or unsteadiness, supv for safety, limited by fatigue, occasional use of hand rail for balance   Stairs             Wheelchair Mobility    Modified Rankin (Stroke Patients Only)       Balance Overall balance assessment: Modified Independent                                          Cognition Arousal/Alertness: Awake/alert Behavior During Therapy: WFL for tasks assessed/performed Overall Cognitive Status: Within Functional Limits for tasks assessed                                        Exercises General Exercises - Lower Extremity Long Arc Quad: Seated;AROM;Strengthening;Both;10 reps Hip Flexion/Marching: AROM;Strengthening;Both;Seated;10 reps Toe Raises: Seated;AROM;Strengthening;Both;10 reps Heel Raises: Seated;AROM;Strengthening;Both;10 reps    General Comments        Pertinent Vitals/Pain Pain Assessment: No/denies pain    Home Living                      Prior Function            PT Goals (current goals can now be found in the care plan section) Acute Rehab PT Goals Patient Stated Goal: "Get feeling better, I don't have any energy" PT Goal Formulation: With  patient Time For Goal Achievement: 03/12/21 Potential to Achieve Goals: Good Progress towards PT goals: Progressing toward goals    Frequency    Min 3X/week      PT Plan Current plan remains appropriate    Co-evaluation              AM-PAC PT "6 Clicks" Mobility   Outcome Measure  Help needed turning from your back to your side while in a flat bed without using bedrails?: None Help needed moving from lying on your back to sitting on the side of a flat bed without using bedrails?: None Help needed moving to and from a bed to a chair (including a wheelchair)?: None Help needed standing  up from a chair using your arms (e.g., wheelchair or bedside chair)?: None Help needed to walk in hospital room?: A Little Help needed climbing 3-5 steps with a railing? : A Little 6 Click Score: 22    End of Session   Activity Tolerance: Patient limited by fatigue Patient left: in bed;with call bell/phone within reach Nurse Communication: Mobility status PT Visit Diagnosis: Unsteadiness on feet (R26.81);Muscle weakness (generalized) (M62.81);Difficulty in walking, not elsewhere classified (R26.2)     Time: 9371-6967 PT Time Calculation (min) (ACUTE ONLY): 18 min  Charges:  $Therapeutic Activity: 8-22 mins                    3:53 PM, 03/08/21 Ocie Cornfield SPT  3:53 PM, 03/08/21 Ocie Bob, MPT Physical Therapist with [facility] [phone] office [phone] mobile phone

## 2021-03-08 NOTE — Plan of Care (Signed)

## 2021-03-08 NOTE — Progress Notes (Signed)
    Subjective: Denies abdominal pain, nausea, vomiting. Good appetite. No diarrhea. No overt GI bleeding. No mental status changes or confusion. No GI complaints today.   Objective: Vital signs in last 24 hours: Temp:  [97.6 F (36.4 C)-98.3 F (36.8 C)] 98.3 F (36.8 C) (06/30 0245) Pulse Rate:  [68-96] 96 (06/30 0245) Resp:  [18-20] 20 (06/30 0245) BP: (113-133)/(62-67) 114/63 (06/30 0245) SpO2:  [97 %-100 %] 97 % (06/30 0245) Last BM Date: 03/07/21 (per patient) General:   Resting in bed but easily awakens  Head:  Normocephalic and atraumatic. Abdomen:  Bowel sounds present, soft, non-tender, non-distended. Obese Extremities:  Without edema. Neurologic:  Alert and  oriented x4 Psych:  Alert and cooperative. Normal mood and affect.  Intake/Output from previous day: 06/29 0701 - 06/30 0700 In: 480 [P.O.:480] Out: 750 [Urine:750] Intake/Output this shift: No intake/output data recorded.  Lab Results: Recent Labs    03/06/21 0358 03/07/21 1226 03/08/21 0438  WBC 2.5* 2.3* 2.7*  HGB 9.4* 8.9* 8.5*  HCT 30.9* 28.7* 28.3*  PLT 93* 137* 165   BMET Recent Labs    03/06/21 0358 03/07/21 0536 03/08/21 0438  NA 136 137 137  K 3.9 4.1 4.3  CL 107 111 111  CO2 22 21* 20*  GLUCOSE 100* 117* 109*  BUN 50* 49* 42*  CREATININE 4.25* 4.44* 4.35*  CALCIUM 7.2* 7.5* 7.6*   LFT Recent Labs    03/06/21 0358 03/07/21 0536 03/08/21 0438  PROT 5.6* 5.6* 5.8*  ALBUMIN 2.4* 2.4* 2.5*  AST 515* 448* 391*  ALT 115* 126* 132*  ALKPHOS 49 49 46  BILITOT 0.9 0.7 0.2*   PT/INR Recent Labs    03/06/21 1230  LABPROT 13.7  INR 1.1     Assessment: 35 year old female admitted 6/26 for syncope, hypotension, transaminitis, AKI, pancytopenia, with outpatient symptoms including fever, cough, N/V/D, and multiple ED visits. GI consulted for elevated transaminases.    Hepatocellular injury: AST and ALT 779 and 129 respectively on admission, previously normal. [place] with fatty  liver, patent portal vein. Extensive evaluation including negative acute hepatitis panel, AMA, ANA, ASMA, immunoglobulins, iron studies (ferritin elevated as acute phase reactant/injury and sats not elevated), CMV IgM negative, EBV remains pending. INR normal. Felt most likely ischemic etiology in setting of hypotension. Less likely drug-induced liver injury but unable to exclude. Possibly bystander reaction in light of acute illness, still under investigation. HIV antibody positive and continue to wait on HIV 1/2 AB differentiation. Encouragingly, she has had continued steady improvement in AST although ALT slowly trending up but not significantly different from admission (today 132). Will need to follow and consider liver biopsy if no improvement.   AKI: creatinine peaking at 4.44 yesterday, with some improvement today. Nephrology following closely.   Pancytopenia: HIV confirmatory test pending. Multiple differentials. Acute onset.   Sore throat: improved. Per notes yesterday, concern for oral thrush. No odynophagia today. Continue nystatin.   Heme positive stool on admission: no overt GI bleeding. Felt most likely this is a contaminant as patient was on her menstrual cycle on admission.   From a GI standpoint, she has no complaints.    Plan: Repeat HFP, INR in am EBV serologies, CMV DNA remain in process Continue Nystatin Will continue to follow with you     Gelene Mink, PhD, ANP-BC [facility]    LOS: 4 days    03/08/2021, 10:50 AM

## 2021-03-08 NOTE — Progress Notes (Signed)
Received epic alert that this patient has positive HIV 4th generation testing.   Upon further review of reflex confirmatory testing:  HIV RNA (+) HIV -1 Ab (-)   C/W acute HIV infection (especially with history of recent pharyngitis symptoms in the last 3 weeks documented and 1 new sexual partner recently per primary team's note).   Will order HIV RNA quantitative test and genotype - will d/w ID MD in AM to assist.    Rexene Alberts, MSN, NP-C [facility]  [email or web address] Pager: [phone] Office: [phone] [facility] Main Line: [phone]

## 2021-03-08 NOTE — Progress Notes (Signed)
Patient ID: Joan Peterson, female   DOB: Sep 23, 1985, 35 y.o.   MRN: [id number] S:Feeling better O:BP 114/63 (BP Location: Left Arm)   Pulse 96   Temp 98.3 F (36.8 C) (Oral)   Resp 20   Ht 5\' 2"  (1.575 m)   Wt 117.9 kg   LMP 02/27/2021   SpO2 97%   BMI 47.55 kg/m   Intake/Output Summary (Last 24 hours) at 03/08/2021 0954 Last data filed at 03/08/2021 0542 Gross per 24 hour  Intake 480 ml  Output 750 ml  Net -270 ml   Intake/Output: I/O last 3 completed shifts: In: 720 [P.O.:720] Out: 950 [Urine:950]  Intake/Output this shift:  No intake/output data recorded. Weight change:  Gen: NAD CVS: RRR Resp: CTA  Abd:+BS, soft, NT/ND Ext: no edema, small abscess on left thigh and knuckle of right 3rd finger  Recent Labs  Lab 03/04/21 1701 03/05/21 0518 03/06/21 0358 03/07/21 0536 03/08/21 0438  NA 130* 133* 136 137 137  K 3.8 3.7 3.9 4.1 4.3  CL 102 103 107 111 111  CO2 19* 21* 22 21* 20*  GLUCOSE 144* 119* 100* 117* 109*  BUN 33* 40* 50* 49* 42*  CREATININE 3.04* 3.53* 4.25* 4.44* 4.35*  ALBUMIN 2.8* 2.5* 2.4* 2.4* 2.5*  CALCIUM 7.4* 7.3* 7.2* 7.5* 7.6*  AST 779* 678* 515* 448* 391*  ALT 129* 116* 115* 126* 132*   Liver Function Tests: Recent Labs  Lab 03/06/21 0358 03/07/21 0536 03/08/21 0438  AST 515* 448* 391*  ALT 115* 126* 132*  ALKPHOS 49 49 46  BILITOT 0.9 0.7 0.2*  PROT 5.6* 5.6* 5.8*  ALBUMIN 2.4* 2.4* 2.5*   No results for input(s): LIPASE, AMYLASE in the last 168 hours. Recent Labs  Lab 03/04/21 2019  AMMONIA 25   CBC: Recent Labs  Lab 03/04/21 1701 03/05/21 0518 03/06/21 0358 03/07/21 1226 03/08/21 0438  WBC 2.9* 2.1* 2.5* 2.3* 2.7*  NEUTROABS 1.9  --  1.2* 1.2* 1.6*  HGB 11.9* 10.6* 9.4* 8.9* 8.5*  HCT 37.9 34.7* 30.9* 28.7* 28.3*  MCV 77.5* 78.9* 81.1 80.8 82.0  PLT 98* 90* 93* 137* 165   Cardiac Enzymes: No results for input(s): CKTOTAL, CKMB, CKMBINDEX, TROPONINI in the last 168 hours. CBG: No results for input(s): GLUCAP  in the last 168 hours.  Iron Studies: No results for input(s): IRON, TIBC, TRANSFERRIN, FERRITIN in the last 72 hours. Studies/Results: No results found.  calcium carbonate  1 tablet Oral BID   feeding supplement  1 Container Oral TID BM   nystatin  5 mL Oral QID   pantoprazole  40 mg Oral Daily    BMET    Component Value Date/Time   NA 137 03/08/2021 0438   K 4.3 03/08/2021 0438   CL 111 03/08/2021 0438   CO2 20 (L) 03/08/2021 0438   GLUCOSE 109 (H) 03/08/2021 0438   BUN 42 (H) 03/08/2021 0438   CREATININE 4.35 (H) 03/08/2021 0438   CALCIUM 7.6 (L) 03/08/2021 0438   GFRNONAA 13 (L) 03/08/2021 0438   CBC    Component Value Date/Time   WBC 2.7 (L) 03/08/2021 0438   RBC 3.45 (L) 03/08/2021 0438   HGB 8.5 (L) 03/08/2021 0438   HCT 28.3 (L) 03/08/2021 0438   PLT 165 03/08/2021 0438   MCV 82.0 03/08/2021 0438   MCH 24.6 (L) 03/08/2021 0438   MCHC 30.0 03/08/2021 0438   RDW 15.9 (H) 03/08/2021 0438   LYMPHSABS 0.6 (L) 03/08/2021 0438   MONOABS 0.3  03/08/2021 0438   EOSABS 0.0 03/08/2021 0438   BASOSABS 0.0 03/08/2021 0438     Assessment/Plan:  AKI - in setting of hypotension, volume depletion, pancytopenia, and abnormal LFT's.  Possible ischemic ATN but need to rule out systemic vasculitis/acute GN.  She had been treated with abx for pharyngitis 3 weeks ago so AIN or post-infectious GN on DDx as well as ANCA.  Starting to make urine and will need to send studies.  CT of abdomen done last week was unrevealing.  Tickborne diseases panel pending. ANA and ASO negative, normal complements. 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 Discussed possible role of renal biopsy pending serologies. No indication for dialysis at this time.  Abnormal LFT's- workup underway and GI consulted. Pancytopenia - Lyme disease panel negative, RMSF and Ehrlichia pending.  Recommend Heme consultation and possible bone marrow  biopsy.  Serologies and SPEP/UPEP pending.  HIV confirmatory test pending. Orthostatic hypotension - receiving IVF's. Pustule on left thigh - has been treated with abx per primary svc. HIV + antibody test.  Will need to be confirmed, however acute HIV infection can present with URI symptoms.  She denies any unprotected sex except for her boyfriend of 14 years.  ID consult if confirmatory test positive.  Donetta Potts, MD [employer] [phone]

## 2021-03-08 NOTE — Progress Notes (Signed)
Pt has refused her scheduled TUMS, nystatin and nutritional supplements.Dr. Sherryll Burger notified.

## 2021-03-09 ENCOUNTER — Other Ambulatory Visit (HOSPITAL_COMMUNITY): Payer: Self-pay

## 2021-03-09 LAB — CBC WITH DIFFERENTIAL/PLATELET
Abs Immature Granulocytes: 0.1 10*3/uL — ABNORMAL HIGH (ref 0.00–0.07)
Basophils Absolute: 0 10*3/uL (ref 0.0–0.1)
Basophils Relative: 0 %
Eosinophils Absolute: 0 10*3/uL (ref 0.0–0.5)
Eosinophils Relative: 1 %
HCT: 28.7 % — ABNORMAL LOW (ref 36.0–46.0)
Hemoglobin: 8.6 g/dL — ABNORMAL LOW (ref 12.0–15.0)
Immature Granulocytes: 4 %
Lymphocytes Relative: 25 %
Lymphs Abs: 0.6 10*3/uL — ABNORMAL LOW (ref 0.7–4.0)
MCH: 24.3 pg — ABNORMAL LOW (ref 26.0–34.0)
MCHC: 30 g/dL (ref 30.0–36.0)
MCV: 81.1 fL (ref 80.0–100.0)
Monocytes Absolute: 0.3 10*3/uL (ref 0.1–1.0)
Monocytes Relative: 12 %
Neutro Abs: 1.4 10*3/uL — ABNORMAL LOW (ref 1.7–7.7)
Neutrophils Relative %: 58 %
Platelets: 210 10*3/uL (ref 150–400)
RBC: 3.54 MIL/uL — ABNORMAL LOW (ref 3.87–5.11)
RDW: 15.8 % — ABNORMAL HIGH (ref 11.5–15.5)
WBC: 2.3 10*3/uL — ABNORMAL LOW (ref 4.0–10.5)
nRBC: 0 % (ref 0.0–0.2)

## 2021-03-09 LAB — COMPREHENSIVE METABOLIC PANEL
ALT: 139 U/L — ABNORMAL HIGH (ref 0–44)
AST: 257 U/L — ABNORMAL HIGH (ref 15–41)
Albumin: 2.6 g/dL — ABNORMAL LOW (ref 3.5–5.0)
Alkaline Phosphatase: 50 U/L (ref 38–126)
Anion gap: 7 (ref 5–15)
BUN: 34 mg/dL — ABNORMAL HIGH (ref 6–20)
CO2: 21 mmol/L — ABNORMAL LOW (ref 22–32)
Calcium: 8 mg/dL — ABNORMAL LOW (ref 8.9–10.3)
Chloride: 111 mmol/L (ref 98–111)
Creatinine, Ser: 4.02 mg/dL — ABNORMAL HIGH (ref 0.44–1.00)
GFR, Estimated: 14 mL/min — ABNORMAL LOW (ref >60)
Glucose, Bld: 94 mg/dL (ref 70–99)
Potassium: 4.4 mmol/L (ref 3.5–5.1)
Sodium: 139 mmol/L (ref 135–145)
Total Bilirubin: 0.5 mg/dL (ref 0.3–1.2)
Total Protein: 6 g/dL — ABNORMAL LOW (ref 6.5–8.1)

## 2021-03-09 LAB — EPSTEIN-BARR VIRUS VCA, IGM: EBV VCA IgM: <36 U/mL (ref 0.0–35.9)

## 2021-03-09 LAB — PROTIME-INR
INR: 1.1 (ref 0.8–1.2)
Prothrombin Time: 14 seconds (ref 11.4–15.2)

## 2021-03-09 LAB — CMV DNA, QUANTITATIVE, PCR
CMV DNA Quant: NEGATIVE IU/mL
Log10 CMV Qn DNA Pl: UNDETERMINED log10 IU/mL

## 2021-03-09 MED ORDER — NYSTATIN 100000 UNIT/ML MT SUSP
5.0000 mL | Freq: Four times a day (QID) | OROMUCOSAL | 0 refills | Status: AC
Start: 2021-03-09 — End: ?
  Filled 2021-03-09: qty 60, 3d supply, fill #0

## 2021-03-09 MED ORDER — LAMIVUDINE 150 MG PO TABS
300.0000 mg | ORAL_TABLET | Freq: Every day | ORAL | Status: DC
  Administered 2021-03-09: 300 mg via ORAL
  Filled 2021-03-09 (×4): qty 2

## 2021-03-09 MED ORDER — DOVATO 50-300 MG PO TABS
1.0000 | ORAL_TABLET | ORAL | 5 refills | Status: AC
  Filled 2021-03-09: qty 30, 30d supply, fill #0
  Filled 2021-04-04: qty 30, 30d supply, fill #1
  Filled 2021-05-07: qty 30, 30d supply, fill #2
  Filled 2021-06-06: qty 30, 30d supply, fill #3
  Filled 2021-07-05: qty 30, 30d supply, fill #4
  Filled 2021-07-31: qty 30, 30d supply, fill #5

## 2021-03-09 MED ORDER — DOLUTEGRAVIR SODIUM 50 MG PO TABS
50.0000 mg | ORAL_TABLET | Freq: Every day | ORAL | Status: DC
  Administered 2021-03-09: 50 mg via ORAL
  Filled 2021-03-09 (×4): qty 1

## 2021-03-09 NOTE — Discharge Summary (Signed)
Physician Discharge Summary  Joan Peterson NWG:[id number] DOB: 1985/12/09 DOA: 03/04/2021  PCP: Joan Jasmine, NP  Admit date: 03/04/2021  Discharge date: 03/09/2021  Admitted From: home  Disposition:  home  Recommendations for Outpatient Follow-up:  Follow up with PCP in 1-2 weeks.  PCP to follow up on CMP weekly to monitor renal function PCP to follow up on CBC to monitor pancytopenia.  Hematology referral has been sent Follow up with infectious disease in 1 week for evaluation of CD4 counts, viral load, as well as education on HIV management.  Referral has been sent Follow up with GI; follow up on LFTs and autoimmune liver disease panel, referral sent Follow up with nephrology in [place] clinic 3-4 weeks, referral sent Follow up tickborne diseases panel (Lyme negative, RMSF and Ehrlichia pending) Take Dovato 50-300 mg by mouth daily which will be delivered to her home Continue on other home medications as prior Nystatin per GI as tolerated  Home Health: Outpatient physical therapy  Equipment/Devices: none  Discharge Condition:Stable  CODE STATUS: Full  Diet recommendation: Heart Healthy  Brief/Interim Summary:  [name] is a 35 yo female with past medical history of HSV-2 infection and recent episode of pharyngitis who was admitted 6/26 for syncope, hypostatic hypotension, elevated AST (in the 700s) and AKI. CT abdomen 6/22 showed only findings of a small hiatal hernia. US abdomen 6/27 non-contributory. Workup evaluating for tickborne illnesses, nephrotic/nephritic syndromes, hepatitis panel and autoimmune liver disease obtained. Lyme negative, RSMF, Ehrlichia pending.   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 Patient was initiated on Dovato per ID specialist. RPR, GC/CT negative. CBC showed pancytopenia with minimal improvement over hospitalization, except for normalized thrombocytopenia. Will check CBC outpatient after starting  Devato and consider hematology consult if persists.   Nephrology evaluated patient this hospitalization to manage worsening AKI (Crt 6/20 was 1.3). BUN/Cr trending down as well (34/4). Possible ischemic ATN but also with new diagnosis of HIV. Autoimmune workup showed ANA and ASO negative, normal complements. ANCA, anti-DNA, anti-GBM negative. In light of new HIV diagnosis, refer to nephrology outpatient 3-4 weeks to follow renal function after ART therapy   Gastroenterology evaluated patient as well to manage elevated LFTs (baseline AST/ALT 40/33 -> 700s upon presentation). Continue to trend down (200s/100s) upon discharge. Negative acute hepatitis panel, AMA, ANA, ASMA, immunoglobulins, iron studies (ferritin elevated as acute phase reactant/injury and sats not elevated), CMV IgM negative, BEV remains pending. INR normal. Most likely ischemic etiology in setting of hypotension. Will follow-up LFTs outpatient.   Upon discharge, patient is hemodynamically stable. Patient was counseled on the good prognosis of controlled HIV infection and the importance of taking medications and follow-up with infectious disease specialist. Plans are to follow up with GI and nephrology as noted above. Also will benefit from outpatient physical therapy. Patient appears calm and agreeable with plans.   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 ID alerted and start Dovato (abnormal liver and kidney function limits options for treatment)  Other diagnoses:  Acute kidney injury - in setting of hypotension, volume depletion, pancytopenia and abnormal LFTs. Most likely ischemic ATN. BUN/Cr trending down slowly over course of hospitalization. Will follow up labs including CMP weekly with PCP and  follow-up with nephrology 3-4 weeks.   Elevated  liver function test - in setting of hypotension, volume depletion, pancytopenia and new diagnosis of HIV. Most likely ischemic hepatitis with negative workup as described in summary. LFTs trending down. Will follow up labs including CMP weekly with PCP and gastroenterology 3-4 weeks  Thrombocytopenia - in setting of new diagnosis of HIV, minimal improvement over hospitalization. Will follow up labs including CBC; consider hematology consult if persists.   Discharge Instructions - Take Dovato 50-300 mg tablet every day for HIV infection - Take nystatin 35ml by mouth four times daily for oral thrush  - Follow up with your primary care physician next week about labs including kidney, liver functions and blood count (CBC).  - Follow up with your kidney physician in [place] clinic 3-4 weeks - Follow up with infectious disease specialist next week - Discuss pre-hospital medications with your PCP and specialists Discharge Instructions     Ambulatory referral to Gastroenterology   Complete by: As directed    Ambulatory referral to Infectious Disease   Complete by: As directed    Ambulatory referral to Nephrology   Complete by: As directed    AKI follow up   Diet - low sodium heart healthy   Complete by: As directed    Increase activity slowly   Complete by: As directed       Allergies as of 03/09/2021       Reactions   Ibuprofen Hives   Penicillins Hives        Medication List     STOP taking these medications    ciprofloxacin 500 MG tablet Commonly known as: CIPRO       TAKE these medications    acetaminophen 325 MG tablet Commonly known as: TYLENOL Take 650 mg by mouth every 6 (six) hours as needed for mild pain or headache.   citalopram 10 MG tablet Commonly known as: CELEXA Take 10 mg by mouth daily.   Dovato 50-300 MG Tabs Generic drug: Dolutegravir-lamiVUDine Take 1 tablet by mouth daily.   ferrous sulfate  325 (65 FE) MG tablet Take 325 mg by mouth daily.   nystatin 100000 UNIT/ML suspension Commonly known as: MYCOSTATIN Take 5 mLs (500,000 Units total) by mouth 4 (four) times daily.   omeprazole 40 MG capsule Commonly known as: PRILOSEC Take 1 capsule by mouth daily.               Durable Medical Equipment  (From admission, onward)           Start     Ordered   03/05/21 1054  For home use only DME Walker rolling  Once       Question Answer Comment  Walker: With Alda Wheels   Patient needs a walker to treat with the following condition Generalized weakness      03/05/21 1053            Follow-up Information     Allayne Butcher, NP. Schedule an appointment as soon as possible for a visit in 1 week(s).   Specialty: Nurse Practitioner Contact information: [place] [address] [place]. Schedule an appointment as soon as possible for a visit in 1 week(s).   Contact information: [place], [facility]. Schedule an appointment as soon as possible for a visit in 3 week(s).   Contact information: [address] [place] [address]  [phone]         [facility]             . Schedule an appointment as soon as possible for a visit in 1 week(s).   Contact information: [address] [place] [address]        Rehab, [facility].   Specialties: Physical Therapy, Occupational Therapy Why: Staff will call you to schdule your visits. Please call them if you don't hear from them. Contact information: [address] [place] [address] [phone]                Allergies  Allergen Reactions   Ibuprofen Hives   Penicillins Hives    Consultations: Nephrology Gastroenterology  Infectious  disease   Procedures/Studies: CT ABDOMEN PELVIS W CONTRAST  Result Date: 02/28/2021 CLINICAL DATA:  Nausea, diarrhea, fever and headache.  Syncope. EXAM: CT ABDOMEN AND PELVIS WITH CONTRAST TECHNIQUE: Multidetector CT imaging of the abdomen and pelvis was performed using the standard protocol following bolus administration of intravenous contrast. CONTRAST:  OMNIPAQUE IOHEXOL 300 MG/ML  SOLN COMPARISON:  None. FINDINGS: Lower chest: Minimal patchy ground-glass in the right lower lobe possibly due to scarring. Heart is at the upper limits of normal in size. No pericardial or pleural effusion. Distal esophagus is grossly unremarkable. Small hiatal hernia. Hepatobiliary: Liver and gallbladder are unremarkable. No biliary ductal dilatation. Pancreas: Negative. Spleen: Negative. Adrenals/Urinary Tract: Adrenal glands and kidneys are unremarkable. Ureters are decompressed. Bladder is grossly unremarkable. Stomach/Bowel: Small hiatal hernia. Stomach, small bowel, appendix and colon are otherwise unremarkable. Vascular/Lymphatic: Vascular structures are unremarkable. Scattered lymph nodes are not enlarged by CT size criteria. Reproductive: Uterus is visualized.  No adnexal mass. Other: No free fluid.  Mesenteries and peritoneum are unremarkable. Musculoskeletal: None. IMPRESSION: No findings to explain the patient's clinical history. Electronically Signed   By: Leanna Battles M.D.   On: 02/28/2021 13:02   DG Chest Portable 1 View  Result Date: 03/04/2021 CLINICAL DATA:  Productive cough EXAM: PORTABLE CHEST 1 VIEW COMPARISON:  Radiograph 02/26/2021 FINDINGS: No consolidation, features of edema, pneumothorax, or effusion. Pulmonary vascularity is normally distributed. The cardiomediastinal contours are unremarkable. No acute osseous or soft tissue abnormality. Telemetry leads overlie the chest. IMPRESSION: No acute cardiopulmonary abnormality. Electronically Signed   By: Kreg Shropshire M.D.   On: 03/04/2021  17:15   DG Chest Port 1 View  Result Date: 02/26/2021 CLINICAL DATA:  35 year old female with cough and shortness of breath. EXAM: PORTABLE CHEST 1 VIEW COMPARISON:  None. FINDINGS: No focal consolidation, pleural effusion or pneumothorax. The cardiac silhouette is within limits. No acute osseous pathology. IMPRESSION: No active disease. Electronically Signed   By: Elgie Collard M.D.   On: 02/26/2021 17:57   US Abdomen Limited RUQ (LIVER/GB)  Result Date: 03/05/2021 CLINICAL DATA:  Transaminitis EXAM: ULTRASOUND ABDOMEN LIMITED RIGHT UPPER QUADRANT COMPARISON:  CT abdomen pelvis 02/28/2021 FINDINGS: Gallbladder: No gallstones or wall thickening visualized. No sonographic Murphy sign noted by sonographer. Common bile duct: Diameter: 4 mm Liver: No focal lesion identified. Within normal limits in parenchymal echogenicity. Portal vein is patent on color Doppler imaging with normal direction of blood flow towards the liver. Other: None. IMPRESSION: No significant sonographic abnormality of the liver or gallbladder Electronically Signed   By: Acquanetta Belling M.D.   On: 03/05/2021 15:25     Discharge Exam: Vitals:   03/08/21 2045 03/09/21 0242  BP: 124/66 Marland Kitchen)  121/58  Pulse: 79 73  Resp: 20 20  Temp: 99.1 F (37.3 C) 98.2 F (36.8 C)  SpO2: 100% 100%   Vitals:   03/08/21 0245 03/08/21 1315 03/08/21 2045 03/09/21 0242  BP: 114/63 134/72 124/66 (!) 121/58  Pulse: 96 71 79 73  Resp: 20 16 20 20   Temp: 98.3 F (36.8 C) 98.4 F (36.9 C) 99.1 F (37.3 C) 98.2 F (36.8 C)  TempSrc: Oral Oral Oral   SpO2: 97% 100% 100% 100%  Weight:      Height:        General: Pt is alert, awake, not in acute distress, obese Cardiovascular: RRR, S1/S2 +, no rubs, no gallops Respiratory: CTA bilaterally, no wheezing, no rhonchi Abdominal: Soft, NT, ND, bowel sounds + Extremities: no edema, no cyanosis, small abscess on left thigh    The results of significant diagnostics from this hospitalization  (including imaging, microbiology, ancillary and laboratory) are listed below for reference.     Microbiology: Recent Results (from the past 240 hour(s))  Resp Panel by RT-PCR (Flu A&B, Covid) Nasopharyngeal Swab     Status: None   Collection Time: 03/04/21  6:11 PM   Specimen: Nasopharyngeal Swab; Nasopharyngeal(NP) swabs in vial transport medium  Result Value Ref Range Status   SARS Coronavirus 2 by RT PCR NEGATIVE NEGATIVE Final    Comment: (NOTE) SARS-CoV-2 target nucleic acids are NOT DETECTED.  The SARS-CoV-2 RNA is generally detectable in upper respiratory specimens during the acute phase of infection. The lowest concentration of SARS-CoV-2 viral copies this assay can detect is 138 copies/mL. A negative result does not preclude SARS-Cov-2 infection and should not be used as the sole basis for treatment or other patient management decisions. A negative result may occur with  improper specimen collection/handling, submission of specimen other than nasopharyngeal swab, presence of viral mutation(s) within the areas targeted by this assay, and inadequate number of viral copies(<138 copies/mL). A negative result must be combined with clinical observations, patient history, and epidemiological information. The expected result is Negative.  Fact Sheet for Patients:  BloggerCourse.comhttps://www.fda.gov/media/152166/download  Fact Sheet for Healthcare Providers:  SeriousBroker.ithttps://www.fda.gov/media/152162/download  This test is no t yet approved or cleared by the [place] FDA and  has been authorized for detection and/or diagnosis of SARS-CoV-2 by FDA under an Emergency Use Authorization (EUA). This EUA will remain  in effect (meaning this test can be used) for the duration of the COVID-19 declaration under Section 564(b)(1) of the Act, 21 U.S.C.section 360bbb-3(b)(1), unless the authorization is terminated  or revoked sooner.       Influenza A by PCR NEGATIVE NEGATIVE Final   Influenza B by PCR  NEGATIVE NEGATIVE Final    Comment: (NOTE) The Xpert Xpress SARS-CoV-2/FLU/RSV plus assay is intended as an aid in the diagnosis of influenza from Nasopharyngeal swab specimens and should not be used as a sole basis for treatment. Nasal washings and aspirates are unacceptable for Xpert Xpress SARS-CoV-2/FLU/RSV testing.  Fact Sheet for Patients: BloggerCourse.comhttps://www.fda.gov/media/152166/download  Fact Sheet for Healthcare Providers: SeriousBroker.ithttps://www.fda.gov/media/152162/download  This test is not yet approved or cleared by the [place] FDA and has been authorized for detection and/or diagnosis of SARS-CoV-2 by FDA under an Emergency Use Authorization (EUA). This EUA will remain in effect (meaning this test can be used) for the duration of the COVID-19 declaration under Section 564(b)(1) of the Act, 21 U.S.C. section 360bbb-3(b)(1), unless the authorization is terminated or revoked.  Performed at [facility], [address] [place], [place] [id number]  Culture, group A strep     Status: None   Collection Time: 03/05/21 12:00 PM   Specimen: Throat  Result Value Ref Range Status   Specimen Description   Final    THROAT Performed at [facility], [address] [place], [place] [address]    Special Requests   Final    NONE Performed at [facility], [address], [place], [place] [address]    Culture   Final    NO GROUP A STREP (S.PYOGENES) ISOLATED Performed at [facility] [address], [place], [place] [address]    Report Status 03/08/2021 FINAL  Final     Labs: BNP (last 3 results) No results for input(s): BNP in the last 8760 hours. Basic Metabolic Panel: Recent Labs  Lab 03/05/21 0518 03/06/21 0358 03/07/21 0536 03/08/21 0438 03/09/21 0549  NA 133* 136 137 137 139  K 3.7 3.9 4.1 4.3 4.4  CL 103 107 111 111 111  CO2 21* 22 21* 20* 21*  GLUCOSE 119* 100* 117* 109* 94  BUN 40* 50* 49* 42* 34*  CREATININE 3.53* 4.25* 4.44* 4.35* 4.02*  CALCIUM 7.3* 7.2*  7.5* 7.6* 8.0*  MG  --  2.5*  --   --   --    Liver Function Tests: Recent Labs  Lab 03/05/21 0518 03/06/21 0358 03/07/21 0536 03/08/21 0438 03/09/21 0549  AST 678* 515* 448* 391* 257*  ALT 116* 115* 126* 132* 139*  ALKPHOS 53 49 49 46 50  BILITOT 0.9 0.9 0.7 0.2* 0.5  PROT 5.9* 5.6* 5.6* 5.8* 6.0*  ALBUMIN 2.5* 2.4* 2.4* 2.5* 2.6*   No results for input(s): LIPASE, AMYLASE in the last 168 hours. Recent Labs  Lab 03/04/21 2019  AMMONIA 25   CBC: Recent Labs  Lab 03/04/21 1701 03/05/21 0518 03/06/21 0358 03/07/21 1226 03/08/21 0438 03/09/21 0549  WBC 2.9* 2.1* 2.5* 2.3* 2.7* 2.3*  NEUTROABS 1.9  --  1.2* 1.2* 1.6* 1.4*  HGB 11.9* 10.6* 9.4* 8.9* 8.5* 8.6*  HCT 37.9 34.7* 30.9* 28.7* 28.3* 28.7*  MCV 77.5* 78.9* 81.1 80.8 82.0 81.1  PLT 98* 90* 93* 137* 165 210   Cardiac Enzymes: No results for input(s): CKTOTAL, CKMB, CKMBINDEX, TROPONINI in the last 168 hours. BNP: Invalid input(s): POCBNP CBG: No results for input(s): GLUCAP in the last 168 hours. D-Dimer No results for input(s): DDIMER in the last 72 hours. Hgb A1c No results for input(s): HGBA1C in the last 72 hours. Lipid Profile No results for input(s): CHOL, HDL, LDLCALC, TRIG, CHOLHDL, LDLDIRECT in the last 72 hours. Thyroid function studies No results for input(s): TSH, T4TOTAL, T3FREE, THYROIDAB in the last 72 hours.  Invalid input(s): FREET3 Anemia work up No results for input(s): VITAMINB12, FOLATE, FERRITIN, TIBC, IRON, RETICCTPCT in the last 72 hours. Urinalysis    Component Value Date/Time   COLORURINE YELLOW 03/06/2021 0829   APPEARANCEUR CLEAR 03/06/2021 0829   LABSPEC 1.010 03/06/2021 0829   PHURINE 6.0 03/06/2021 0829   GLUCOSEU NEGATIVE 03/06/2021 0829   HGBUR LARGE (A) 03/06/2021 0829   BILIRUBINUR NEGATIVE 03/06/2021 0829   KETONESUR NEGATIVE 03/06/2021 0829   PROTEINUR 100 (A) 03/06/2021 0829   UROBILINOGEN 0.2 08/10/2011 1035   NITRITE NEGATIVE 03/06/2021 0829    LEUKOCYTESUR TRACE (A) 03/06/2021 0829   Sepsis Labs Invalid input(s): PROCALCITONIN,  WBC,  LACTICIDVEN Microbiology Recent Results (from the past 240 hour(s))  Resp Panel by RT-PCR (Flu A&B, Covid) Nasopharyngeal Swab     Status: None  Collection Time: 03/04/21  6:11 PM   Specimen: Nasopharyngeal Swab; Nasopharyngeal(NP) swabs in vial transport medium  Result Value Ref Range Status   SARS Coronavirus 2 by RT PCR NEGATIVE NEGATIVE Final    Comment: (NOTE) SARS-CoV-2 target nucleic acids are NOT DETECTED.  The SARS-CoV-2 RNA is generally detectable in upper respiratory specimens during the acute phase of infection. The lowest concentration of SARS-CoV-2 viral copies this assay can detect is 138 copies/mL. A negative result does not preclude SARS-Cov-2 infection and should not be used as the sole basis for treatment or other patient management decisions. A negative result may occur with  improper specimen collection/handling, submission of specimen other than nasopharyngeal swab, presence of viral mutation(s) within the areas targeted by this assay, and inadequate number of viral copies(<138 copies/mL). A negative result must be combined with clinical observations, patient history, and epidemiological information. The expected result is Negative.  Fact Sheet for Patients:  BloggerCourse.com  Fact Sheet for Healthcare Providers:  SeriousBroker.it  This test is no t yet approved or cleared by the [place] FDA and  has been authorized for detection and/or diagnosis of SARS-CoV-2 by FDA under an Emergency Use Authorization (EUA). This EUA will remain  in effect (meaning this test can be used) for the duration of the COVID-19 declaration under Section 564(b)(1) of the Act, 21 U.S.C.section 360bbb-3(b)(1), unless the authorization is terminated  or revoked sooner.       Influenza A by PCR NEGATIVE NEGATIVE Final   Influenza B  by PCR NEGATIVE NEGATIVE Final    Comment: (NOTE) The Xpert Xpress SARS-CoV-2/FLU/RSV plus assay is intended as an aid in the diagnosis of influenza from Nasopharyngeal swab specimens and should not be used as a sole basis for treatment. Nasal washings and aspirates are unacceptable for Xpert Xpress SARS-CoV-2/FLU/RSV testing.  Fact Sheet for Patients: BloggerCourse.com  Fact Sheet for Healthcare Providers: SeriousBroker.it  This test is not yet approved or cleared by the [place] FDA and has been authorized for detection and/or diagnosis of SARS-CoV-2 by FDA under an Emergency Use Authorization (EUA). This EUA will remain in effect (meaning this test can be used) for the duration of the COVID-19 declaration under Section 564(b)(1) of the Act, 21 U.S.C. section 360bbb-3(b)(1), unless the authorization is terminated or revoked.  Performed at [facility], [address], [place], [place] [address]   Culture, group A strep     Status: None   Collection Time: 03/05/21 12:00 PM   Specimen: Throat  Result Value Ref Range Status   Specimen Description   Final    THROAT Performed at [facility], [address], [place], [place] [address]    Special Requests   Final    NONE Performed at [facility], [address], [place], [place] [address]    Culture   Final    NO GROUP A STREP (S.PYOGENES) ISOLATED Performed at [facility], [address] [place], [place] [address]    Report Status 03/08/2021 FINAL  Final   Addendum:  Patient with newly diagnosed HIV infection with management as noted above.  She will need to have further follow-up labs in the coming weeks to ensure stability of her renal and liver function tests.  She is otherwise in stable condition for discharge and will follow-up as noted above with multiple specialists.  Referrals have been sent and this will be arranged in the near future.  Agree with note as  otherwise noted above.  Time coordinating discharge:  35 minutes  SIGNED:   Dshaun Reppucci Darleen Crocker [facility] 03/09/2021, 12:13 PM  If 7PM-7AM, please contact night-coverage [email or web address]

## 2021-03-09 NOTE — TOC Transition Note (Signed)
Transition of Care [facility]) - CM/SW Discharge Note   Patient Details  Name: Joan Peterson MRN: [id number] Date of Birth: [birth year]  Transition of Care [facility]) CM/SW Contact:  Elliot Gault, LCSW Phone Number: 03/09/2021, 12:17 PM   Clinical Narrative:     Pt stable for dc today per MD. Outpatient PT recommended and ordered. Spoke with pt and referred to outpatient PT in [place] as requested. Information added to AVS. Pt stated that she did not have any other TOC needs for dc.  Expected Discharge Plan: Home/Self Care Barriers to Discharge: Barriers Resolved   Patient Goals and CMS Choice Patient states their goals for this hospitalization and ongoing recovery are:: go home CMS Medicare.gov Compare Post Acute Care list provided to:: Patient Choice offered to / list presented to : Patient  Expected Discharge Plan and Services Expected Discharge Plan: Home/Self Care In-house Referral: Clinical Social Work Discharge Planning Services: Follow-up appt scheduled   Living arrangements for the past 2 months: Single Family Home Expected Discharge Date: 03/09/21                                    Prior Living Arrangements/Services Living arrangements for the past 2 months: Single Family Home Lives with:: Self Patient language and need for interpreter reviewed:: Yes Do you feel safe going back to the place where you live?: Yes      Need for Family Participation in Patient Care: Yes (Comment) Care giver support system in place?: Yes (comment)   Criminal Activity/Legal Involvement Pertinent to Current Situation/Hospitalization: No - Comment as needed  Activities of Daily Living Home Assistive Devices/Equipment: None ADL Screening (condition at time of admission) Patient's cognitive ability adequate to safely complete daily activities?: Yes Is the patient deaf or have difficulty hearing?: No Does the patient have difficulty seeing, even when wearing glasses/contacts?:  No Does the patient have difficulty concentrating, remembering, or making decisions?: No Patient able to express need for assistance with ADLs?: Yes Does the patient have difficulty dressing or bathing?: No Independently performs ADLs?: Yes (appropriate for developmental age) Does the patient have difficulty walking or climbing stairs?: No Weakness of Legs: None Weakness of Arms/Hands: None  Permission Sought/Granted Permission sought to share information with : Facility Industrial/product designer granted to share information with : Yes, Verbal Permission Granted     Permission granted to share info w AGENCY: outpatient PT        Emotional Assessment   Attitude/Demeanor/Rapport: Engaged Affect (typically observed): Pleasant Orientation: : Oriented to Self, Oriented to Place, Oriented to  Time, Oriented to Situation Alcohol / Substance Use: Not Applicable Psych Involvement: No (comment)  Admission diagnosis:  Syncope and collapse [R55] Elevated LFTs [R79.89] AKI (acute kidney injury) (HCC) [N17.9] Patient Active Problem List   Diagnosis Date Noted   Elevated LFTs    Pancytopenia (HCC)    AKI (acute kidney injury) (HCC) 03/04/2021   Transaminitis 03/04/2021   Syncope and collapse 03/04/2021   Thrombocytopenia (HCC) 03/04/2021   Gestational age, 71 weeks 04/01/2012   Single liveborn, born in hospital, delivered by cesarean delivery 04/01/2012   Pregnancy 03/23/2012   PCP:  Rema Jasmine, NP Pharmacy:   [facility] [phone] [place], [place] - [address] [place] [address] Phone: [phone] Fax: [phone]  [facility] [address] [place] [address] Phone: [phone] Fax: [phone]     Social  Determinants of Health (SDOH) Interventions    Readmission Risk Interventions Readmission Risk Prevention Plan 03/09/2021  Transportation Screening Complete  PCP or Specialist Appt within 5-7 Days Complete  Home  Care Screening Complete  Medication Review (RN CM) Complete  Some recent data might be hidden     Final next level of care: Home/Self Care Barriers to Discharge: Barriers Resolved   Patient Goals and CMS Choice Patient states their goals for this hospitalization and ongoing recovery are:: go home CMS Medicare.gov Compare Post Acute Care list provided to:: Patient Choice offered to / list presented to : Patient  Discharge Placement                       Discharge Plan and Services In-house Referral: Clinical Social Work Discharge Planning Services: Follow-up appt scheduled                                 Social Determinants of Health (SDOH) Interventions     Readmission Risk Interventions Readmission Risk Prevention Plan 03/09/2021  Transportation Screening Complete  PCP or Specialist Appt within 5-7 Days Complete  Home Care Screening Complete  Medication Review (RN CM) Complete  Some recent data might be hidden

## 2021-03-09 NOTE — Progress Notes (Signed)
Patient ID: Joan Peterson, female   DOB: [birth year], 35 y.o.   MRN: [id number] S: No new complaints O:BP (!) 121/58 (BP Location: Left Arm)   Pulse 73   Temp 98.2 F (36.8 C)   Resp 20   Ht 5' 2"  (1.575 m)   Wt 117.9 kg   LMP 02/27/2021   SpO2 100%   BMI 47.55 kg/m   Intake/Output Summary (Last 24 hours) at 03/09/2021 0936 Last data filed at 03/08/2021 2100 Gross per 24 hour  Intake 240 ml  Output 600 ml  Net -360 ml   Intake/Output: I/O last 3 completed shifts: In: 240 [P.O.:240] Out: 1050 [Urine:1050]  Intake/Output this shift:  No intake/output data recorded. Weight change:  Gen: NAD CVS: RRR Resp: CTA Abd: benign Ext: no edema  Recent Labs  Lab 03/04/21 1701 03/05/21 0518 03/06/21 0358 03/07/21 0536 03/08/21 0438 03/09/21 0549  NA 130* 133* 136 137 137 139  K 3.8 3.7 3.9 4.1 4.3 4.4  CL 102 103 107 111 111 111  CO2 19* 21* 22 21* 20* 21*  GLUCOSE 144* 119* 100* 117* 109* 94  BUN 33* 40* 50* 49* 42* 34*  CREATININE 3.04* 3.53* 4.25* 4.44* 4.35* 4.02*  ALBUMIN 2.8* 2.5* 2.4* 2.4* 2.5* 2.6*  CALCIUM 7.4* 7.3* 7.2* 7.5* 7.6* 8.0*  AST 779* 678* 515* 448* 391* 257*  ALT 129* 116* 115* 126* 132* 139*   Liver Function Tests: Recent Labs  Lab 03/07/21 0536 03/08/21 0438 03/09/21 0549  AST 448* 391* 257*  ALT 126* 132* 139*  ALKPHOS 49 46 50  BILITOT 0.7 0.2* 0.5  PROT 5.6* 5.8* 6.0*  ALBUMIN 2.4* 2.5* 2.6*   No results for input(s): LIPASE, AMYLASE in the last 168 hours. Recent Labs  Lab 03/04/21 2019  AMMONIA 25   CBC: Recent Labs  Lab 03/05/21 0518 03/06/21 0358 03/07/21 1226 03/08/21 0438 03/09/21 0549  WBC 2.1* 2.5* 2.3* 2.7* 2.3*  NEUTROABS  --  1.2* 1.2* 1.6* 1.4*  HGB 10.6* 9.4* 8.9* 8.5* 8.6*  HCT 34.7* 30.9* 28.7* 28.3* 28.7*  MCV 78.9* 81.1 80.8 82.0 81.1  PLT 90* 93* 137* 165 210   Cardiac Enzymes: No results for input(s): CKTOTAL, CKMB, CKMBINDEX, TROPONINI in the last 168 hours. CBG: No results for input(s): GLUCAP in  the last 168 hours.  Iron Studies: No results for input(s): IRON, TIBC, TRANSFERRIN, FERRITIN in the last 72 hours. Studies/Results: No results found.  calcium carbonate  1 tablet Oral BID   dolutegravir  50 mg Oral Daily   And   lamiVUDine  300 mg Oral Daily   feeding supplement  1 Container Oral TID BM   nystatin  5 mL Oral QID   pantoprazole  40 mg Oral Daily    BMET    Component Value Date/Time   NA 139 03/09/2021 0549   K 4.4 03/09/2021 0549   CL 111 03/09/2021 0549   CO2 21 (L) 03/09/2021 0549   GLUCOSE 94 03/09/2021 0549   BUN 34 (H) 03/09/2021 0549   CREATININE 4.02 (H) 03/09/2021 0549   CALCIUM 8.0 (L) 03/09/2021 0549   GFRNONAA 14 (L) 03/09/2021 0549   CBC    Component Value Date/Time   WBC 2.3 (L) 03/09/2021 0549   RBC 3.54 (L) 03/09/2021 0549   HGB 8.6 (L) 03/09/2021 0549   HCT 28.7 (L) 03/09/2021 0549   PLT 210 03/09/2021 0549   MCV 81.1 03/09/2021 0549   MCH 24.3 (L) 03/09/2021 0549   MCHC 30.0  03/09/2021 0549   RDW 15.8 (H) 03/09/2021 0549   LYMPHSABS 0.6 (L) 03/09/2021 0549   MONOABS 0.3 03/09/2021 0549   EOSABS 0.0 03/09/2021 0549   BASOSABS 0.0 03/09/2021 0549     Assessment/Plan:  AKI - in setting of hypotension, volume depletion, pancytopenia, and abnormal LFT's.  Possible ischemic ATN but also with new diagnosis of HIV.  acute GN workup negative and she has had improved UOP and BUN/Cr.  CT of abdomen done last week was unrevealing.  Tickborne diseases panel pending. ANA and ASO negative, normal complements. 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 Discussed possible role of renal biopsy pending serologies, however in light of new HIV diagnosis would follow renal function after ART therapy. No indication for dialysis at this time.  Will arrange for outpatient follow up in our [place] clinic in 3-4 weeks. Will see her again next week if still in the hospital but otherwise  will review labs remotely. New/Acute HIV infection - ID consulted and trying to start Dovato (abnormal liver and kidney function limits options for treatment). Abnormal LFT's- workup underway and GI consulted. Pancytopenia - Lyme disease panel negative, RMSF and Ehrlichia pending.  Recommend Heme consultation and possible bone marrow biopsy.  Serologies and SPEP/UPEP pending.  HIV confirmatory test + Orthostatic hypotension - receiving IVF's. Pustule on left thigh - has been treated with abx per primary svc.  Donetta Potts, MD [employer] [phone]

## 2021-03-09 NOTE — Progress Notes (Signed)
Pt with new/acute HIV+.  Her Cr is elevated as is her LFTs.  I've asked pharm to help dose her on Dovato- due to elevated LFT and increased Cr (avoiding TAF). Dovato is not on forumulary so will use components.  Appreciate Pharm, TRH and Ms Dixon's help.  Thanks

## 2021-03-11 LAB — EPSTEIN BARR VRS(EBV DNA BY PCR): EBV DNA QN by PCR: NEGATIVE IU/mL

## 2021-03-11 LAB — CRYOGLOBULIN: Cryoglobulin: NEGATIVE

## 2021-03-13 ENCOUNTER — Encounter: Payer: Self-pay | Admitting: Internal Medicine

## 2021-03-13 LAB — GLOMERULAR BASEMENT MEMBRANE ANTIBODIES: GBM Ab: <0.2 units (ref 0.0–0.9)

## 2021-03-14 ENCOUNTER — Telehealth: Payer: Self-pay | Admitting: Infectious Disease

## 2021-03-14 ENCOUNTER — Telehealth: Payer: Self-pay | Admitting: *Deleted

## 2021-03-14 NOTE — Telephone Encounter (Signed)
Joan Peterson this is an acute hIV pt, just want to make sure [name] knows this because this is high priority to get her into care asap

## 2021-03-14 NOTE — Telephone Encounter (Signed)
Faxed demographics and HIV antibody test result to [name] at [facility] Andree Coss, RN

## 2021-03-15 LAB — HLA B*5701: HLA B 5701: POSITIVE

## 2021-03-16 LAB — EHRLICHIA ANTIBODY PANEL
E chaffeensis (HGE) Ab, IgG: 1:256 {titer} — ABNORMAL HIGH
E chaffeensis (HGE) Ab, IgM: NEGATIVE
E. Chaffeensis (HME) IgM Titer: NEGATIVE
E.Chaffeensis (HME) IgG: NEGATIVE

## 2021-03-21 NOTE — Telephone Encounter (Signed)
Claris Che, can you try to reach the patient again, please? Thank you!  Marcelino Duster

## 2021-03-21 NOTE — Progress Notes (Signed)
Patient ID: Joan Peterson, female   DOB: 02/18/1986, 34 y.o.   MRN: [id number] Called patient Joan Peterson she advised she is being seen by [facility] Infectious Disease

## 2021-03-22 NOTE — Telephone Encounter (Signed)
Per referral notes dated 03/20/21, patient has connected to Infectious Disease care in [place], [place].

## 2021-03-27 LAB — HIV GENOSURE(R) MG

## 2021-03-27 LAB — HIV-1 RNA, PCR (GRAPH) RFX/GENO EDI
HIV-1 RNA BY PCR: >10000000 copies/mL
HIV-1 RNA Quant, Log: UNDETERMINED log10copy/mL

## 2021-03-27 LAB — REFLEX TO GENOSURE(R) MG EDI: HIV GenoSure(R): 1

## 2021-04-04 ENCOUNTER — Other Ambulatory Visit (HOSPITAL_COMMUNITY): Payer: Self-pay

## 2021-04-05 ENCOUNTER — Other Ambulatory Visit (HOSPITAL_COMMUNITY): Payer: Self-pay

## 2021-05-07 ENCOUNTER — Other Ambulatory Visit (HOSPITAL_COMMUNITY): Payer: Self-pay

## 2021-05-08 ENCOUNTER — Other Ambulatory Visit (HOSPITAL_COMMUNITY): Payer: Self-pay

## 2021-05-30 ENCOUNTER — Other Ambulatory Visit (HOSPITAL_COMMUNITY): Payer: Self-pay

## 2021-06-06 ENCOUNTER — Other Ambulatory Visit (HOSPITAL_COMMUNITY): Payer: Self-pay

## 2021-07-03 ENCOUNTER — Other Ambulatory Visit (HOSPITAL_COMMUNITY): Payer: Self-pay

## 2021-07-04 ENCOUNTER — Ambulatory Visit: Payer: Medicaid Other | Admitting: Gastroenterology

## 2021-07-05 ENCOUNTER — Other Ambulatory Visit (HOSPITAL_COMMUNITY): Payer: Self-pay

## 2021-07-06 ENCOUNTER — Other Ambulatory Visit (HOSPITAL_COMMUNITY): Payer: Self-pay

## 2021-07-12 ENCOUNTER — Ambulatory Visit: Payer: Medicaid Other | Admitting: Internal Medicine

## 2021-07-27 ENCOUNTER — Other Ambulatory Visit (HOSPITAL_COMMUNITY): Payer: Self-pay

## 2021-07-30 ENCOUNTER — Other Ambulatory Visit (HOSPITAL_COMMUNITY): Payer: Self-pay

## 2021-07-31 ENCOUNTER — Other Ambulatory Visit (HOSPITAL_COMMUNITY): Payer: Self-pay

## 2021-08-01 ENCOUNTER — Other Ambulatory Visit (HOSPITAL_COMMUNITY): Payer: Self-pay

## 2021-08-03 ENCOUNTER — Other Ambulatory Visit (HOSPITAL_COMMUNITY): Payer: Self-pay

## 2021-08-28 ENCOUNTER — Other Ambulatory Visit (HOSPITAL_COMMUNITY): Payer: Self-pay

## 2021-09-07 ENCOUNTER — Other Ambulatory Visit: Payer: Self-pay | Admitting: Infectious Diseases

## 2021-09-07 ENCOUNTER — Other Ambulatory Visit (HOSPITAL_COMMUNITY): Payer: Self-pay

## 2021-09-10 ENCOUNTER — Other Ambulatory Visit (HOSPITAL_COMMUNITY): Payer: Self-pay

## 2021-09-11 ENCOUNTER — Other Ambulatory Visit (HOSPITAL_COMMUNITY): Payer: Self-pay

## 2021-09-12 ENCOUNTER — Other Ambulatory Visit (HOSPITAL_COMMUNITY): Payer: Self-pay

## 2021-09-17 ENCOUNTER — Other Ambulatory Visit (HOSPITAL_COMMUNITY): Payer: Self-pay

## 2022-03-06 ENCOUNTER — Other Ambulatory Visit: Payer: Self-pay | Admitting: Internal Medicine

## 2022-03-06 DIAGNOSIS — R7989 Other specified abnormal findings of blood chemistry: Secondary | ICD-10-CM

## 2022-03-06 DIAGNOSIS — N179 Acute kidney failure, unspecified: Secondary | ICD-10-CM

## 2022-07-26 ENCOUNTER — Other Ambulatory Visit (HOSPITAL_COMMUNITY): Payer: Self-pay

## 2023-01-06 IMAGING — DX DG CHEST 1V PORT
1 series · 1 of 1 positions shown · non-contrast
Comparison: Radiograph 02/26/2021

CLINICAL DATA: Productive cough

EXAM:
PORTABLE CHEST 1 VIEW

[chest ap]
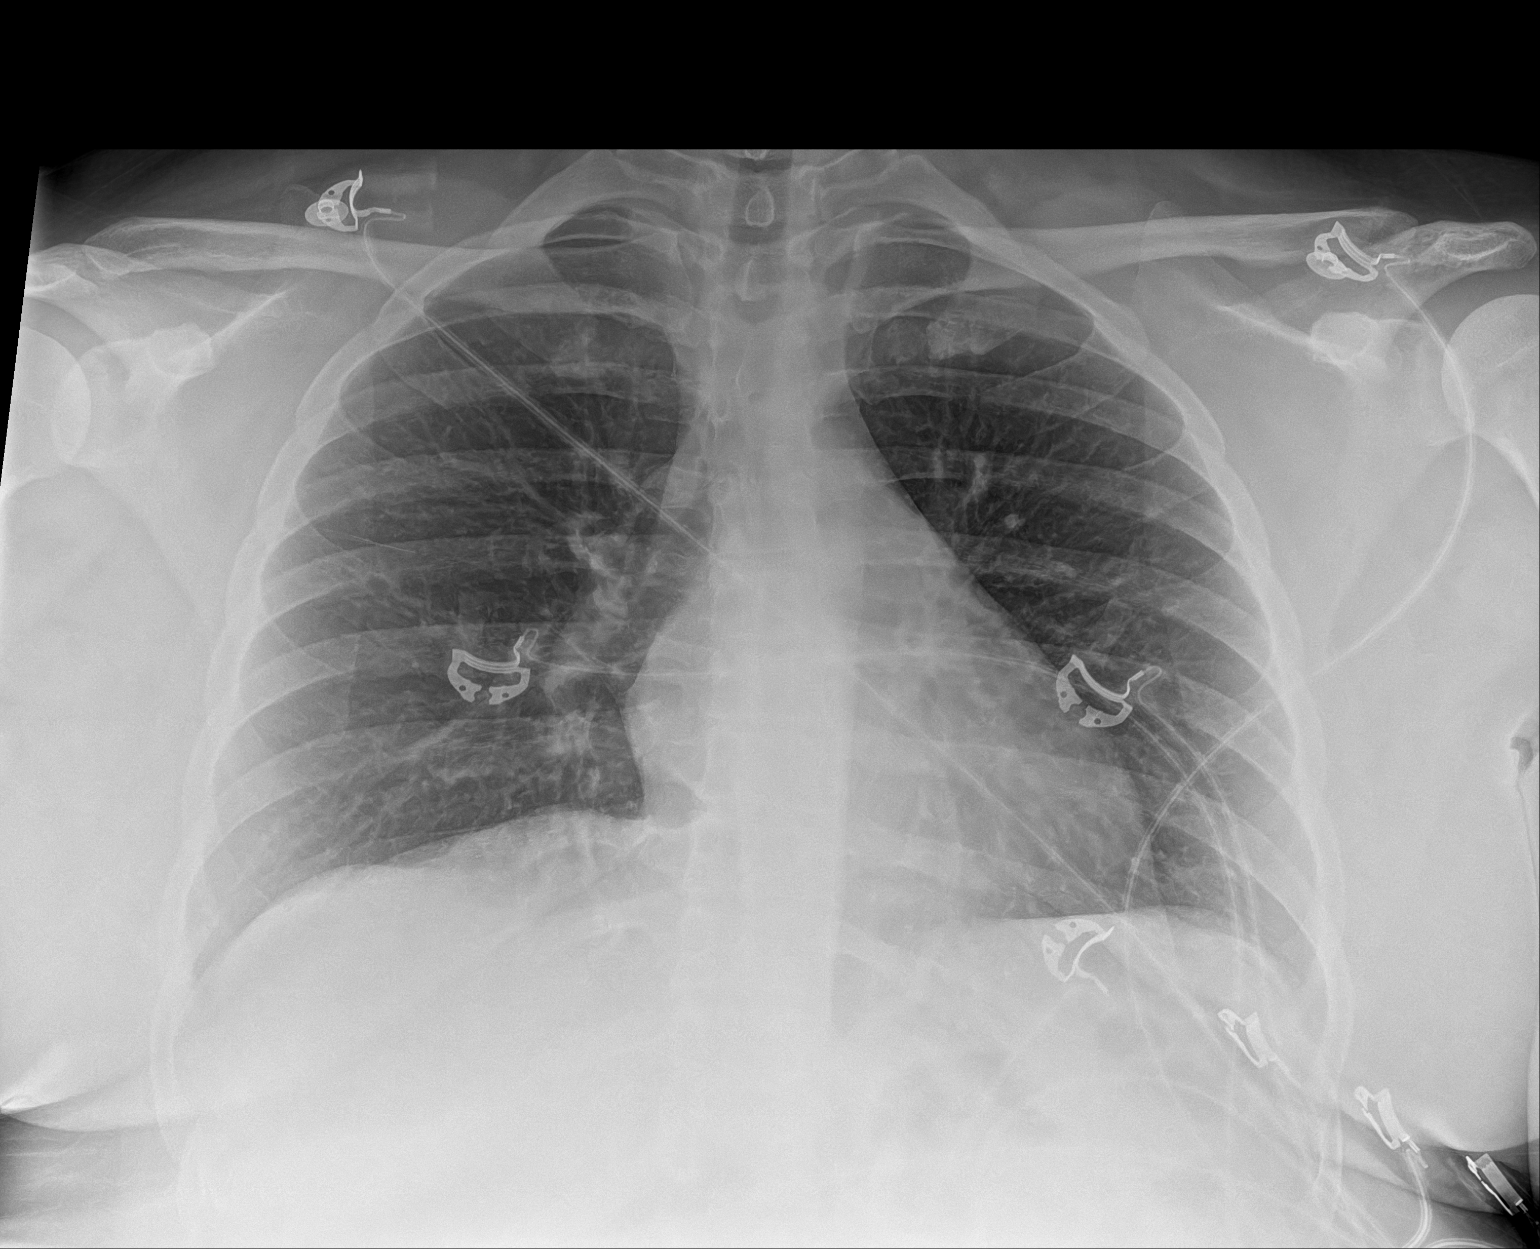

[1 of 1 positions shown; findings below may reference images not displayed]

FINDINGS: No consolidation, features of edema, pneumothorax, or effusion.
Pulmonary vascularity is normally distributed. The cardiomediastinal
contours are unremarkable. No acute osseous or soft tissue
abnormality. Telemetry leads overlie the chest.
IMPRESSION: No acute cardiopulmonary abnormality.

## 2023-01-07 IMAGING — US US ABDOMEN LIMITED
1 series · 14 of 25 positions shown · non-contrast
Comparison: CT abdomen pelvis 02/28/2021

CLINICAL DATA: Transaminitis

EXAM:
ULTRASOUND ABDOMEN LIMITED RIGHT UPPER QUADRANT

[Series 1: us abdomen limited ruq (liver/gb) · 14 of 39 slices shown]
[im 1/39]
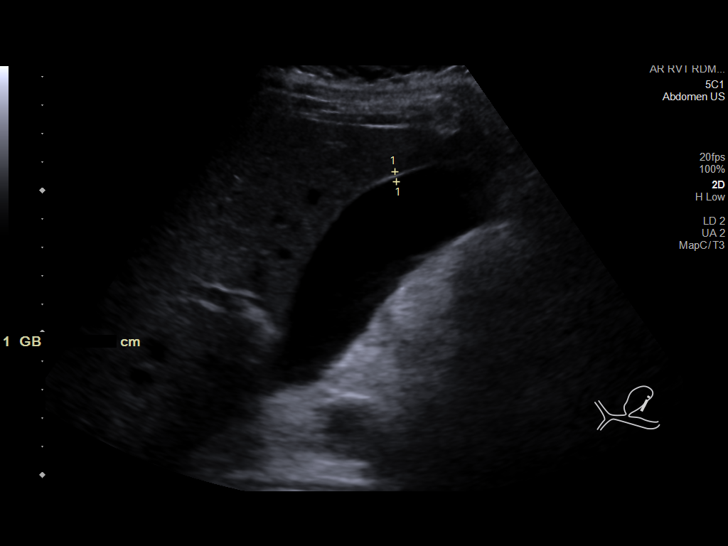
[im 4/39]
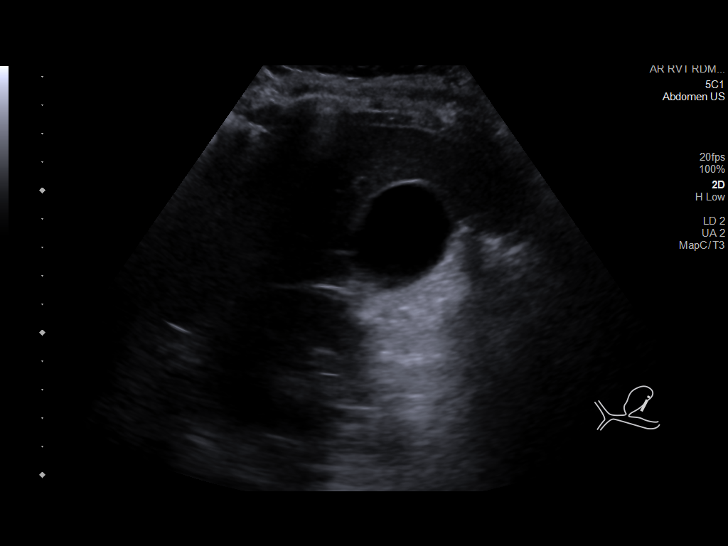
[im 7/39]
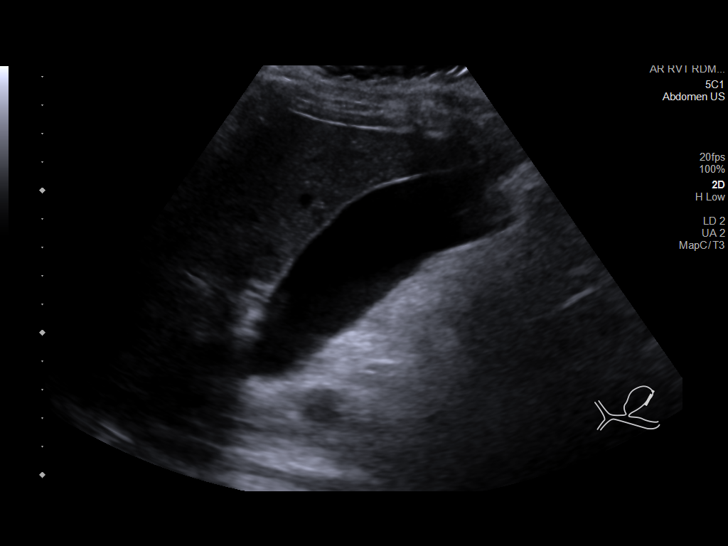
[im 10/39]
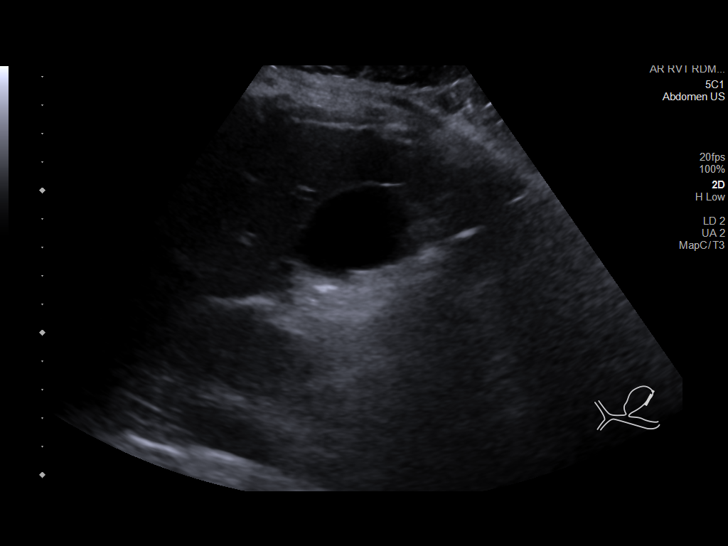
[im 13/39]
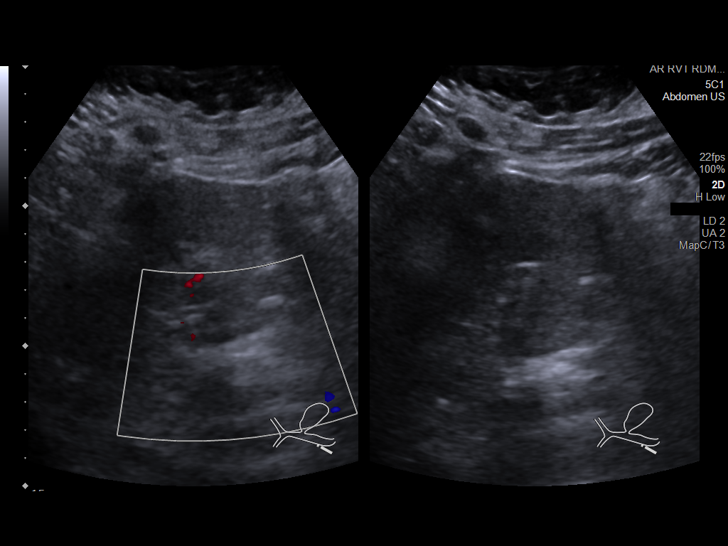
[im 15/39]
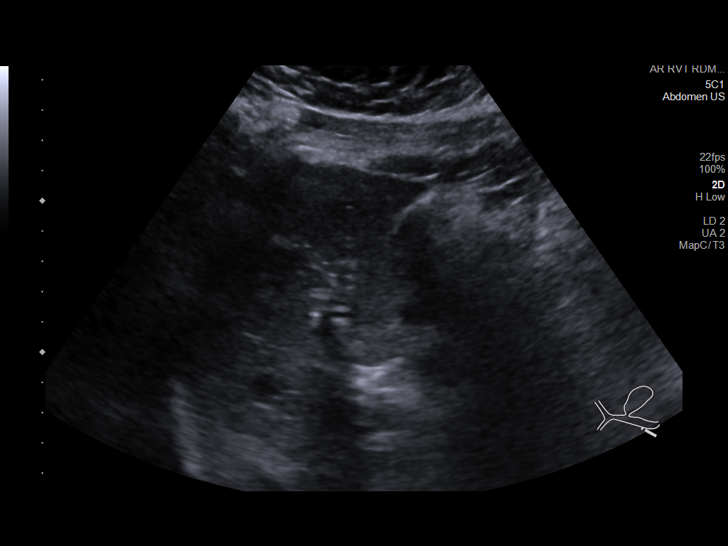
[im 18/39]
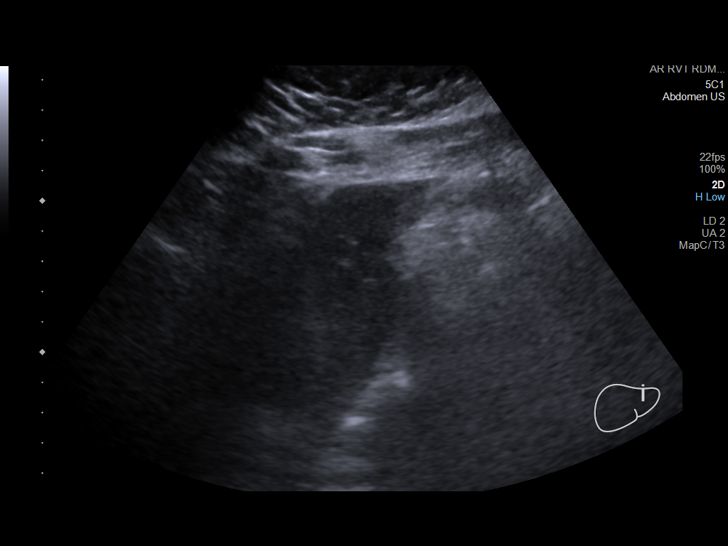
[im 21/39]
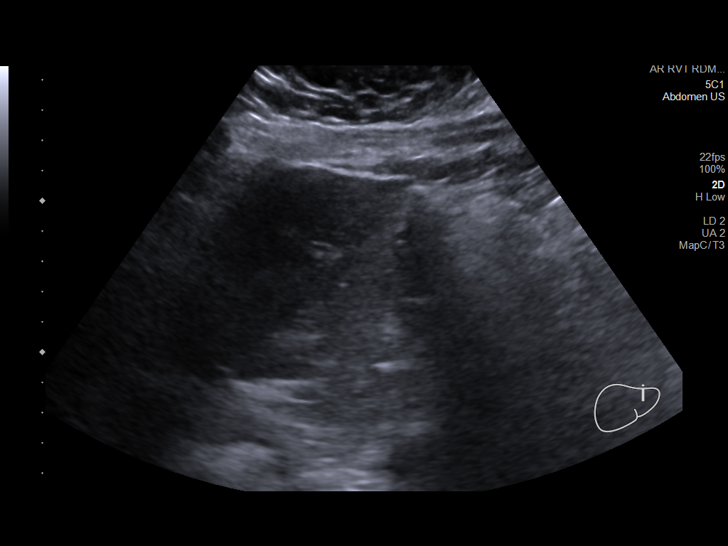
[im 24/39]
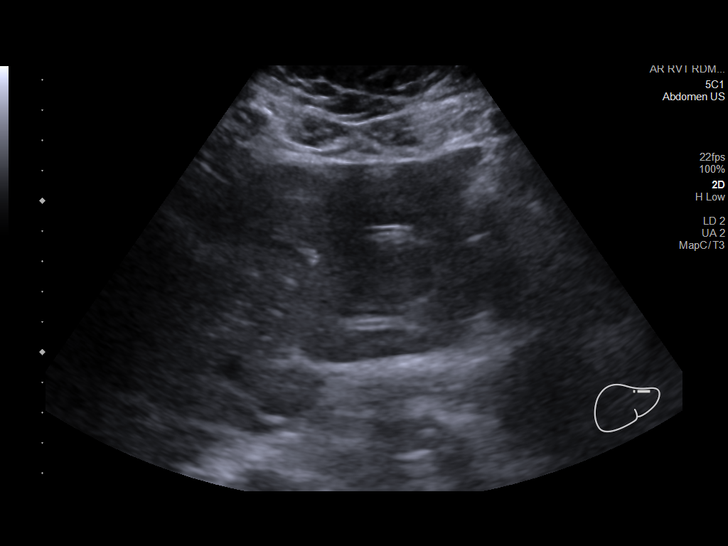
[im 26/39]
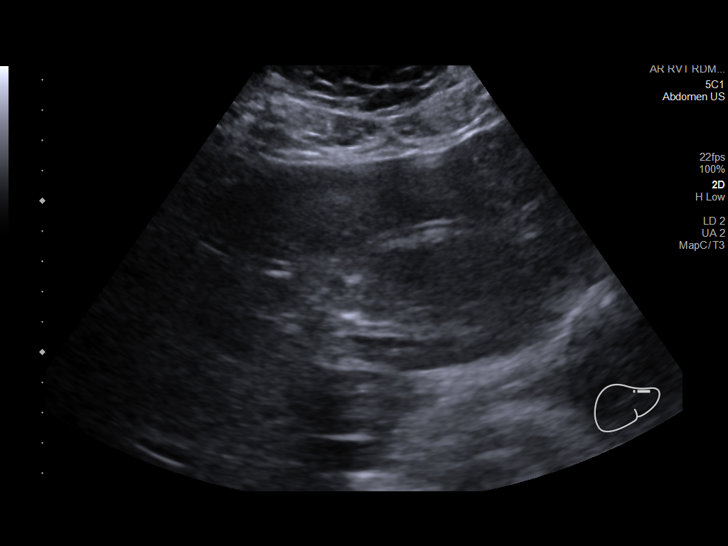
[im 29/39]
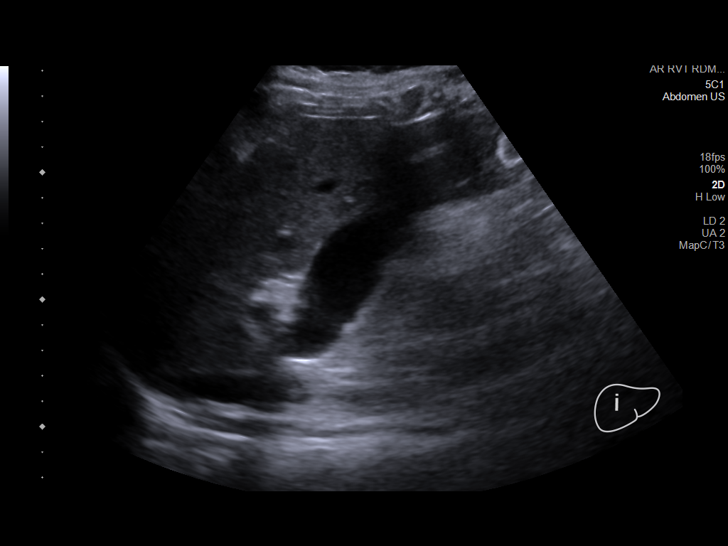
[im 32/39]
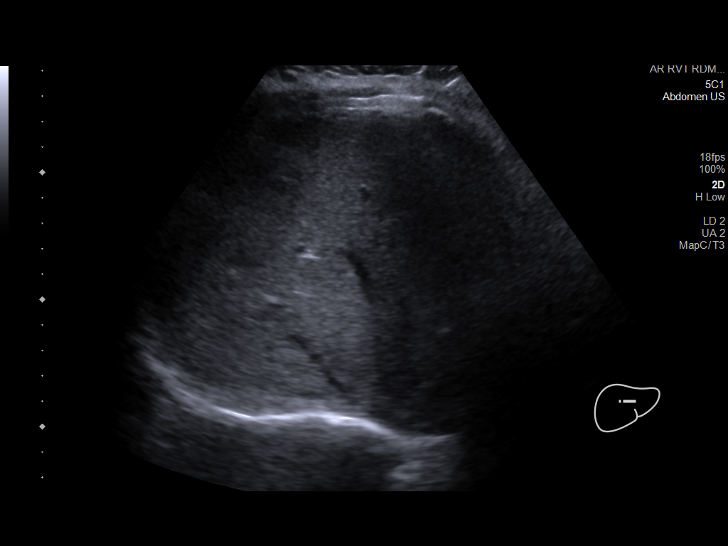
[im 35/39]
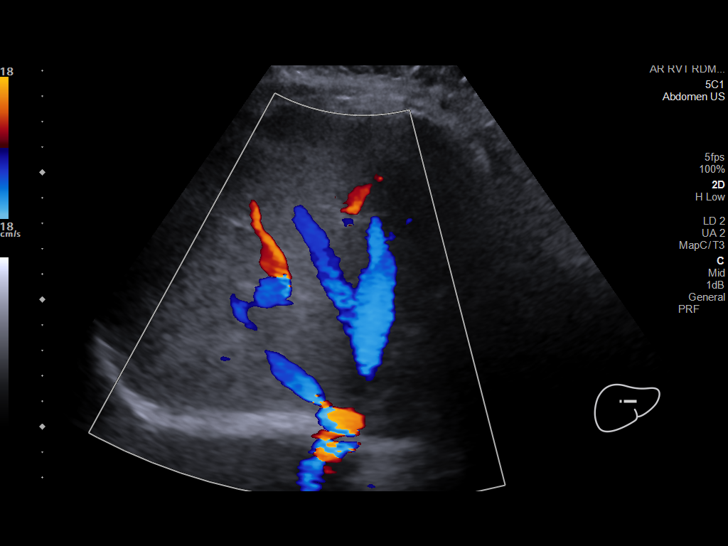
[im 39/39]
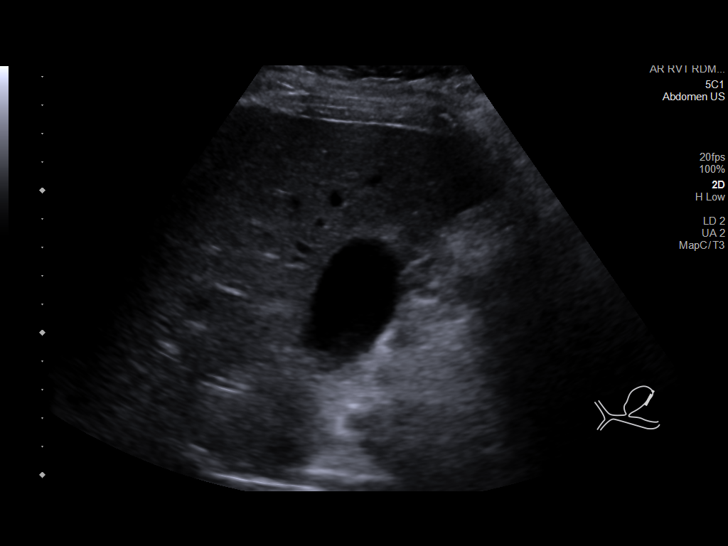

[14 of 25 positions shown; findings below may reference images not displayed]

FINDINGS: Gallbladder:

No gallstones or wall thickening visualized. No sonographic Murphy
sign noted by sonographer.

Common bile duct:

Diameter: 4 mm

Liver:

No focal lesion identified. Within normal limits in parenchymal
echogenicity. Portal vein is patent on color Doppler imaging with
normal direction of blood flow towards the liver.

Other: None.
IMPRESSION: No significant sonographic abnormality of the liver or gallbladder
